# Patient Record
Sex: Male | Born: 1940 | Race: White | Hispanic: No | Marital: Married | Smoking: Former smoker
Health system: Southern US, Community
[De-identification: ages and names within clinical notes are randomized; demographics above are authoritative.]

## PROBLEM LIST (undated history)

## (undated) DIAGNOSIS — E785 Hyperlipidemia, unspecified: Secondary | ICD-10-CM

## (undated) DIAGNOSIS — C4492 Squamous cell carcinoma of skin, unspecified: Secondary | ICD-10-CM

## (undated) DIAGNOSIS — E119 Type 2 diabetes mellitus without complications: Secondary | ICD-10-CM

## (undated) DIAGNOSIS — G4730 Sleep apnea, unspecified: Secondary | ICD-10-CM

## (undated) DIAGNOSIS — C3490 Malignant neoplasm of unspecified part of unspecified bronchus or lung: Secondary | ICD-10-CM

## (undated) DIAGNOSIS — C4491 Basal cell carcinoma of skin, unspecified: Secondary | ICD-10-CM

## (undated) DIAGNOSIS — N200 Calculus of kidney: Secondary | ICD-10-CM

## (undated) DIAGNOSIS — I10 Essential (primary) hypertension: Secondary | ICD-10-CM

## (undated) DIAGNOSIS — M1990 Unspecified osteoarthritis, unspecified site: Secondary | ICD-10-CM

## (undated) DIAGNOSIS — R0600 Dyspnea, unspecified: Secondary | ICD-10-CM

## (undated) DIAGNOSIS — Z87442 Personal history of urinary calculi: Secondary | ICD-10-CM

## (undated) DIAGNOSIS — J449 Chronic obstructive pulmonary disease, unspecified: Secondary | ICD-10-CM

## (undated) DIAGNOSIS — J189 Pneumonia, unspecified organism: Secondary | ICD-10-CM

## (undated) DIAGNOSIS — C679 Malignant neoplasm of bladder, unspecified: Secondary | ICD-10-CM

## (undated) HISTORY — DX: Calculus of kidney: N20.0

## (undated) HISTORY — DX: Chronic obstructive pulmonary disease, unspecified: J44.9

## (undated) HISTORY — DX: Hyperlipidemia, unspecified: E78.5

## (undated) HISTORY — PX: HERNIA REPAIR: SHX51

## (undated) HISTORY — DX: Essential (primary) hypertension: I10

## (undated) HISTORY — PX: BLADDER FULGURATION: SHX1237

---

## 2004-09-08 ENCOUNTER — Ambulatory Visit: Payer: Self-pay | Admitting: Family Medicine

## 2006-04-30 ENCOUNTER — Ambulatory Visit: Payer: Self-pay | Admitting: Family Medicine

## 2006-05-22 ENCOUNTER — Ambulatory Visit: Payer: Self-pay | Admitting: Family Medicine

## 2006-08-12 ENCOUNTER — Other Ambulatory Visit: Payer: Self-pay

## 2006-08-13 ENCOUNTER — Inpatient Hospital Stay: Payer: Self-pay | Admitting: Internal Medicine

## 2007-12-12 ENCOUNTER — Ambulatory Visit: Payer: Self-pay | Admitting: Family Medicine

## 2013-03-22 ENCOUNTER — Ambulatory Visit: Payer: Self-pay | Admitting: Internal Medicine

## 2013-03-28 DIAGNOSIS — C679 Malignant neoplasm of bladder, unspecified: Secondary | ICD-10-CM | POA: Insufficient documentation

## 2013-04-05 ENCOUNTER — Ambulatory Visit: Payer: Self-pay | Admitting: Urology

## 2013-05-04 ENCOUNTER — Ambulatory Visit: Payer: Self-pay | Admitting: Urology

## 2013-05-10 ENCOUNTER — Ambulatory Visit: Payer: Self-pay | Admitting: Urology

## 2013-05-12 LAB — PATHOLOGY REPORT

## 2013-11-08 ENCOUNTER — Ambulatory Visit: Payer: Self-pay | Admitting: Urology

## 2013-11-08 LAB — BASIC METABOLIC PANEL
Anion Gap: 1 — ABNORMAL LOW (ref 7–16)
BUN: 23 mg/dL — AB (ref 7–18)
CALCIUM: 10 mg/dL (ref 8.5–10.1)
CHLORIDE: 103 mmol/L (ref 98–107)
Co2: 36 mmol/L — ABNORMAL HIGH (ref 21–32)
Creatinine: 1.13 mg/dL (ref 0.60–1.30)
EGFR (Non-African Amer.): >60
GLUCOSE: 112 mg/dL — AB (ref 65–99)
Osmolality: 284 (ref 275–301)
Potassium: 4.3 mmol/L (ref 3.5–5.1)
Sodium: 140 mmol/L (ref 136–145)

## 2013-11-22 ENCOUNTER — Ambulatory Visit: Payer: Self-pay | Admitting: Urology

## 2013-11-24 LAB — PATHOLOGY REPORT

## 2014-04-23 DIAGNOSIS — J449 Chronic obstructive pulmonary disease, unspecified: Secondary | ICD-10-CM | POA: Insufficient documentation

## 2014-05-17 ENCOUNTER — Ambulatory Visit: Payer: Self-pay | Admitting: Urology

## 2014-06-26 ENCOUNTER — Ambulatory Visit: Payer: Self-pay | Admitting: Urology

## 2014-06-26 LAB — BASIC METABOLIC PANEL
Anion Gap: 6 — ABNORMAL LOW (ref 7–16)
BUN: 27 mg/dL — ABNORMAL HIGH (ref 7–18)
CALCIUM: 9.6 mg/dL (ref 8.5–10.1)
CREATININE: 1.2 mg/dL (ref 0.60–1.30)
Chloride: 106 mmol/L (ref 98–107)
Co2: 31 mmol/L (ref 21–32)
GFR CALC NON AF AMER: 59 — AB
GLUCOSE: 88 mg/dL (ref 65–99)
Osmolality: 290 (ref 275–301)
POTASSIUM: 3.8 mmol/L (ref 3.5–5.1)
Sodium: 143 mmol/L (ref 136–145)

## 2014-06-26 LAB — CBC WITH DIFFERENTIAL/PLATELET
Basophil #: 0.1 10*3/uL (ref 0.0–0.1)
Basophil %: 0.5 %
EOS ABS: 0.6 10*3/uL (ref 0.0–0.7)
Eosinophil %: 4 %
HCT: 41.8 % (ref 40.0–52.0)
HGB: 12.9 g/dL — ABNORMAL LOW (ref 13.0–18.0)
Lymphocyte #: 5.7 10*3/uL — ABNORMAL HIGH (ref 1.0–3.6)
Lymphocyte %: 38.4 %
MCH: 28.5 pg (ref 26.0–34.0)
MCHC: 30.8 g/dL — AB (ref 32.0–36.0)
MCV: 93 fL (ref 80–100)
Monocyte #: 1 x10 3/mm (ref 0.2–1.0)
Monocyte %: 6.7 %
Neutrophil #: 7.5 10*3/uL — ABNORMAL HIGH (ref 1.4–6.5)
Neutrophil %: 50.4 %
PLATELETS: 309 10*3/uL (ref 150–440)
RBC: 4.52 10*6/uL (ref 4.40–5.90)
RDW: 15.7 % — AB (ref 11.5–14.5)
WBC: 14.8 10*3/uL — ABNORMAL HIGH (ref 3.8–10.6)

## 2014-07-25 ENCOUNTER — Ambulatory Visit: Payer: Self-pay | Admitting: Urology

## 2014-07-30 LAB — PATHOLOGY REPORT

## 2015-02-08 NOTE — Op Note (Signed)
PATIENT NAME:  [name] [name] MR#:  [id number] DATE OF BIRTH:  04-03-1941  DATE OF PROCEDURE:  05/10/2013  PREOPERATIVE DIAGNOSES:   1.  Bladder tumor.  2.  Gross hematuria.  POSTOPERATIVE DIAGNOSES:   1.  Bladder tumor.  2.  Gross hematuria.   PROCEDURES: 1.  Transurethral resection of bladder tumor.  2.  Placement of left ureteral stent.  3.  Bilateral retrograde pyelogram.   SURGEON:  John Giovanni, M.D.   ASSISTANT:  None.   ANESTHESIA:  General.   INDICATIONS:  A 74 year old male with a history of gross hematuria.  His primary care obtained a renal ultrasound which showed normal-appearing kidneys.  He was found to have a hyperechoic exophytic mass of the bladder wall.  He opted for cystoscopy under anesthesia with possible TURBT in lieu of office cystoscopy.  His upper tracts have only been evaluated by renal ultrasound and bilateral retrograde pyelograms will also be performed.   FINDINGS: 1.  Urethra normal in caliber without stricture.  2.  The prostate remarkable for moderate lateral lobe enlargement with mild bladder neck elevation.  3.  Papillary exophytic high-grade-appearing tumor of the posterior bladder wall measuring approximately 3 cm with papillary change extending inferolaterally to obscure the left ureteral orifice.  Right ureteral orifice was normal-appearing with clear efflux.  Clear efflux was noted from the left ureteral orifice.  Remainder of the bladder mucosa was normal in appearance.  4.  Retrograde pyelograms, right ureter normal in caliber without evidence of filling defects, dilation.  The right collecting system was normal in appearance without filling defects.  Left retrograde pyelogram, again normal-appearing ureter and collecting system/renal pelvis.   DESCRIPTION OF PROCEDURE:  The patient was taken to the Operating Room and placed supine on the cystoscopy table.  A general anesthetic was administered via an LMA.  He was then placed in the low lithotomy  position and his external genitalia were prepped and draped in the usual fashion.  A time out was performed per protocol with all in agreement.  The urethral meatus would not accept a 27 French continuous flow resectoscope sheath and the meatus was dilated from 22 to 30 [place] with [employer].  A 27 French continuous flow resectoscope sheath with visual obturator was then lubricated and passed without difficulty.  Panendoscopy was then performed with findings as described above.  Using an adapter in the resectoscope, an 8 French cone tip catheter was placed and right retrograde pyelogram was performed with findings as described above.  Left retrograde pyelogram was performed in a similar fashion.  Under realtime fluoroscopy, there was good efflux of contrast seen visually and radiographically.  Although the papillary tumor did not appear to involve the left ureteral orifice, it was surrounding the orifice and it was elected to place a ureteral catheter.  A 0.025 guidewire was placed into the left ureteral orifice and a 5 French open-ended ureteral catheter was placed over the wire.  The [facility] resectoscope with loop was then placed into the sheath.  The posterior wall tumor was resected down to superficial muscle.  All specimen was irrigated and removed.  The additional superficial papillary tumor was resected.  The papillary tumor in the vicinity of the ureteral orifice was fulgurated.  All specimen was removed.  A second specimen with sections involving the base of tumor was then performed and sent.  Hemostasis was obtained with a button electrode.  At the completion of the procedure, there was no other visible tumor.  Because  of the possibility of ureteral edema, it was elected to place a stent.  The ureteral catheter was removed and a 6 French/24 cm Contour ureteral stent was placed over the guidewire.  There was a good curl seen in the renal pelvis under fluoroscopy.  The distal end of the stent was  well-positioned in the bladder distally.  The stent was left attached to a string.  A 20 French Foley catheter was then placed with return of clear effluent upon irrigation.  The patient was taken to PACU in stable condition.  There were no complications.  EBL was minimal.     ____________________________ [name] [name]. Bernardo Heater, MD scs:ea D: 05/10/2013 16:54:40 ET T: 05/10/2013 19:41:51 ET JOB#: [id number]  cc: Nicki Reaper C. Bernardo Heater, MD, <Dictator> Abbie Sons MD ELECTRONICALLY SIGNED 05/22/2013 8:31

## 2015-02-09 NOTE — Op Note (Signed)
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   POSTOPERATIVE DIAGNOSIS: Transitional cell carcinoma of the bladder.   PROCEDURE: Transurethral resection of bladder tumor.   SURGEON: Danyeal Akens C. Bernardo Heater, M.D.   ASSISTANT: None.  ANESTHESIA: General.   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 He is status post prior TURBT approximately 4 months ago. Intravesical BCG was recommended; however, he refused. Recent surveillance cystoscopy remarkable for recurrent tumor of the left lateral wall.   DESCRIPTION OF PROCEDURE: The patient was taken to the cystoscopy suite and placed on the table in the supine position. A general anesthetic was administered via a LMA. He was then placed in the low lithotomy position and his external genitalia were prepped and draped in the usual fashion. Timeout was performed per protocol with all in agreement. The urethra meatus was sounded to 28-French. A 27-French continuous flow resectoscope sheath with visual obturator was then placed without difficulty. The urethra was normal in caliber without stricture, prostate with moderate lateral lobe enlargement. Bladder mucosa was closely inspected and on the left lateral wall was a broad-based patch of a papillary tumor measuring approximately 4 cm. There was a second 5 mm papillary lesion in the region of the dome. The left lateral wall tumor was resected down to superficial muscle. Hemostasis was obtained with cautery. The ureteral orifices were normal-appearing with clear efflux and the tumor was away from the ureteral orifice. All specimen was removed with irrigation and sent in a separate container. The dome tumor was then resected, however, based on its location, only a small amount of tissue was obtained as the majority of  it was cauterized. All resection sites were then cauterized, in addition to the surrounding mucosa. At the completion of procedure, hemostasis was adequate. All specimen had been removed. The resectoscope was removed. A 20-French Foley catheter was placed with return of clear effluent upon irrigation. A B and O suppository was placed per rectum. The patient was taken to the PAC-U in stable condition. There were no complications.   ESTIMATED BLOOD LOSS: Minimal.  ____________________________ Ronda Fairly. Bernardo Heater, MD scs:sb D: 11/28/2013 08:02:37 ET T: 11/28/2013 09:09:15 ET JOB#: [id number]  cc: Nicki Reaper C. Bernardo Heater, MD, <Dictator> Abbie Sons MD ELECTRONICALLY SIGNED 01/03/2014 9:58

## 2015-02-09 NOTE — Op Note (Signed)
PATIENT NAME:  [name] [name], [name] [name] MR#:  [id number] DATE OF BIRTH:  01/14/1941  DATE OF PROCEDURE:  07/25/2014  PREOPERATIVE DIAGNOSES: history of bladder cancer, left ureteral filling defect and CT urogram.   POSTOPERATIVE DIAGNOSES: No ureteral tumor identified, bladder tumor.   PROCEDURE PERFORMED: Transurethral urethral resection of bladder tumor less than 2 cm, bladder biopsy, left retrograde pyelogram, left ureteroscopy, left ureteral stent placement, instillation of chemotherapeutic agen to the bladder (mitomycin).   ANESTHESIA: General anesthesia.   ATTENDING SURGEON: Sherlynn Stalls, MD.   SPECIMEN: Random bladder biopsy, bladder tumor.  FINDINGS: Approximately 1 cm papillary sessile bladder tumor located on the left lateral wall of the bladder. Multiple suspicious erythematous patches on the posterior aspect of bladder wall. No evidence of left ureteral tumor.   DRAINS: A 6 x 26 French double-J ureteral stent on string (left side), 16 French Foley catheter.   COMPLICATIONS: None.   INDICATION: This is a 74 year old male with a history of bladder cancer found to have suspicious possible filling defect in the left ureter. He was also noted to have some bladder wall thickening suspicious for bladder cancer recurrence. He was counseled to undergo left retrograde pyelogram and left ureteroscopy for diagnostic purposes. Risks and benefits of the procedures were explained in detail. The patient agreed to proceed as planned.  PROCEDURE: The patient was correctly identified in the preoperative holding area and informed consent was obtained. He was brought to the operating suite and placed on the table in the supine position. At this time universal timeout protocol was performed. All team members were identified. Venodyne boots were placed and he was administered 2 grams of IV Ancef in the perioperative period. He was then placed under general anesthesia, positioned in the dorsal lithotomy position  on the bed and prepped and draped in standard surgical fashion.   A 22 French rigid cystoscope was then advanced per urethra into the bladder and the bladder was carefully inspected. This was performed both with a 30 degree and 70 degree lens. 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 There were no other tumors identified within the bladder. The right UO appeared orthotopic and normal; however, the left UO appeared as though it had been previously resected, somewhat patulous and more lateral and superior then would be anatomically expected. There was no evidence of tumors or lesions around this area with normal urothelium. At this point in time, cold cup biopsy forceps were used to biopsy the posterior and posterior lateral walls of the bladder and the areas of the suspicious erythema and these were sent as random bladder biopsies. The left UO was then cannulated using a 5 [place] open-ended ureteral catheter and a retrograde pyelogram was performed. This revealed an absolutely normal caliber ureter all the way up to the level of the renal pelvis. There was no evidence of hydronephrosis or filling defects within the renal pelvis or calyces. There was no concern for ureteral tumor at this time, however, given the findings on CT scan the decision was made to go ahead and proceed with left ureteroscopy in order to confirm this. A sensor was placed on the way up to the level of the kidney under fluoroscopic guidance which was snapped in place with a safety wire. A long rigid ureteroscope was then advanced through the patulous left UO, which revealed a normal left ureter without any evidence of mucosal defects or tumors. This  was able to be advanced all the way up to the level of near the UPJ well past the suspicious areas on CT urogram. As the ureter appeared completely normal the scope was removed and a 6 x 26 French  double-J ureteral stent was advanced over the wire to the level of the renal pelvis and then the wire was withdrawn until an adequate coil was noted both within the bladder and the renal pelvis. The string was left in place which was secured to the glans using Benzoin  and Steri-Strips. Stent placement was performed at the very end of the case and prior to doing this I did bring in a 26 French Olympus bipolar resectoscope to resect the left lateral bladder tumor. This specimen was sent off as bladder tumor.   Hemostasis was achieved using the loop. The bladder was then drained. A 16 French Foley catheter was then introduced alongside of the stent string to the bladder of which the balloon port was then filled with 10 mL of sterile water; 40 mg of mitomycin was then instilled into the bladder and clamped off for approximately an hour. The patient was then awaken from anesthesia and taken to the PACU in stable condition. His Foley was carefully removed in the PACU without complication leaving the stent in place.   There were no complications in this case. He will follow up in a week to review pathology and self remove the stent in 2 days.     ____________________________ Sherlynn Stalls, MD ajb:JT D: 07/25/2014 17:35:21 ET T: 07/25/2014 20:38:56 ET JOB#: [id number]  cc: Sherlynn Stalls, MD, <Dictator> Sherlynn Stalls MD ELECTRONICALLY SIGNED 07/29/2014 9:05

## 2015-05-03 ENCOUNTER — Other Ambulatory Visit: Payer: Self-pay | Admitting: Urology

## 2016-10-05 NOTE — Progress Notes (Signed)
10/06/2016 12:14 PM   Randall Hiss 06/24/1941 [id number]  Referring provider: Albina Billet, MD [address]   [place], [place] [address]  Chief Complaint  Patient presents with  . Hematuria    HPI: Patient is a 75 -year-old Caucasian male who presents today as a referral from their PCP, Dr. Hall Busing, for gross hematuria.    Patient is having intermittent gross hematuria with clots that started one month ago.   HCT was 35.3% in 2014, now it is 30% on 09/21/2016.    He does have a history of high grade urothelial carcinoma undergoing TURBT with left retrograde for diagnosis in 07/2014.  He had an induction course of BCG following this procedure.   He underwent surveillance cystoscopy in 01/2015.  He was to RTC in 3 months, but he cancelled that appointment.   He did not rescheduled that appointment due to feeling overwrought.  He had also seen Dr. Bernardo Heater in the past and BCG was recommended, but the patient refused.    Today, he is having symptoms of frequent urination, urgency, nocturia, incontinence, hesitancy, intermittency and a weak urinary stream. His UA today demonstrates > 30 RBC's and 11-30 WBC's.  He is not experiencing any suprapubic pain, abdominal pain or flank pain. He denies any recent fevers, chills, nausea or vomiting.   He did have a CT Urogram in 04/2014 with regard to potential residual or metachronous transitional cell carcinoma, there is some mild but smooth wall thickening along the left urinary bladder which probably merits cystoscopic investigation, and several subtle foci of potential posterior wall irregularity/thickening in the left ureter best appreciated on the delayed phase images as detailed above, which could represent ureteral tumor.  Numerous ancillary findings including bibasilar scarring, coronary atherosclerosis, low-density blood pool possibly suggesting anemia, chololithiasis, diffuse colonic diverticulosis, umbilical hernia containing adipose tissue,  lower lumbar spondylosis and degenerative disc disease causing bilateral foraminal impingement at L5-S1, mildly prominent prostate gland, the could and a 1.7 cm fluid density nonenhancing structure interposed between the urinary bladder and left inguinal ring which is probably a postoperative fluid collection.   He is a former smoker, with a 50 ppd history.  He is exposed to secondhand smoke.  He has not worked with [employer].    PMH: Past Medical History:  Diagnosis Date  . COPD (chronic obstructive pulmonary disease) ([place])   . Hyperlipidemia   . Hypertension   . Kidney stone     Surgical History: Past Surgical History:  Procedure Laterality Date  . BLADDER FULGURATION    . HERNIA REPAIR      Home Medications:  Allergies as of 10/06/2016   No Known Allergies     Medication List       Accurate as of 10/06/16 12:14 PM. Always use your most recent med list.          albuterol 108 (90 Base) MCG/ACT inhaler Commonly known as:  PROVENTIL HFA;VENTOLIN HFA Inhale into the lungs.   albuterol (2.5 MG/3ML) 0.083% nebulizer solution Commonly known as:  PROVENTIL   aspirin 81 MG chewable tablet once daily.   diphenhydramine-acetaminophen 25-500 MG Tabs tablet Commonly known as:  TYLENOL PM Take by mouth.   DOCOSAHEXAENOIC ACID PO Take by mouth.   glimepiride 2 MG tablet Commonly known as:  AMARYL   lisinopril 40 MG tablet Commonly known as:  PRINIVIL,ZESTRIL 20 mg.   metFORMIN 500 MG 24 hr tablet Commonly known as:  GLUCOPHAGE-XR   simvastatin 40 MG  tablet Commonly known as:  Lopeno 18 MCG inhalation capsule Generic drug:  tiotropium   SYMBICORT 80-4.5 MCG/ACT inhaler Generic drug:  budesonide-formoterol   tamsulosin 0.4 MG Caps capsule Commonly known as:  FLOMAX TAKE 1 TABLET BY MOUTH EVERY NIGHT   umeclidinium-vilanterol 62.5-25 MCG/INH Aepb Commonly known as:  ANORO ELLIPTA Inhale into the lungs.   Vitamin D3 2000  units capsule Take by mouth.       Allergies: No Known Allergies  Family History: Family History  Problem Relation Age of Onset  . Prostate cancer Neg Hx   . Bladder Cancer Neg Hx   . Kidney cancer Neg Hx     Social History:  reports that he has been smoking.  His smokeless tobacco use includes Snuff. He reports that he does not drink alcohol or use drugs.  ROS: UROLOGY Frequent Urination?: Yes Hard to postpone urination?: Yes Burning/pain with urination?: No Get up at night to urinate?: Yes Leakage of urine?: Yes Urine stream starts and stops?: Yes Trouble starting stream?: Yes Do you have to strain to urinate?: No Blood in urine?: Yes Urinary tract infection?: No Sexually transmitted disease?: No Injury to kidneys or bladder?: No Painful intercourse?: No Weak stream?: No Erection problems?: No Penile pain?: No  Gastrointestinal Nausea?: No Vomiting?: No Indigestion/heartburn?: No Diarrhea?: No Constipation?: No  Constitutional Fever: Yes Night sweats?: No Weight loss?: No Fatigue?: Yes  Skin Skin rash/lesions?: No Itching?: No  Eyes Blurred vision?: No Double vision?: No  Ears/Nose/Throat Sore throat?: No Sinus problems?: Yes  Hematologic/Lymphatic Swollen glands?: No Easy bruising?: Yes  Cardiovascular Leg swelling?: No Chest pain?: No  Respiratory Cough?: Yes Shortness of breath?: Yes  Endocrine Excessive thirst?: No  Musculoskeletal Back pain?: Yes Joint pain?: Yes  Neurological Headaches?: No Dizziness?: No  Psychologic Depression?: No Anxiety?: No  Physical Exam: BP 137/66 (BP Location: Left Arm, Patient Position: Sitting, Cuff Size: Normal)   Pulse 74   Ht '5\' 5"'$  (1.651 m)   Wt 180 lb 1.6 oz (81.7 kg)   BMI 29.97 kg/m   Constitutional: Well nourished. Alert and oriented, No acute distress. HEENT: [place] AT, moist mucus membranes. Trachea midline, no masses. Cardiovascular: No clubbing, cyanosis, or  edema. Respiratory: Normal respiratory effort, no increased work of breathing. GI: Abdomen is soft, non tender, non distended, no abdominal masses. Liver and spleen not palpable.  No hernias appreciated.  Stool sample for occult testing is not indicated.   GU: No CVA tenderness.  No bladder fullness or masses.  Patient with uncircumcised phallus. Urethral meatus is patent.  No penile discharge. No penile lesions or rashes. Scrotum without lesions, cysts, rashes and/or edema.  Testicles are located scrotally bilaterally. No masses are appreciated in the testicles. Left and right epididymis are normal. Rectal: Patient with  normal sphincter tone. Anus and perineum without scarring or rashes. No rectal masses are appreciated. Prostate is approximately 50 grams, no nodules are appreciated. Seminal vesicles are normal. Skin: No rashes, bruises or suspicious lesions. Lymph: No cervical or inguinal adenopathy. Neurologic: Grossly intact, no focal deficits, moving all 4 extremities. Psychiatric: Normal mood and affect.  Laboratory Data: Lab Results  Component Value Date   WBC 14.8 (H) 06/26/2014   HGB 12.9 (L) 06/26/2014   HCT 41.8 06/26/2014   MCV 93 06/26/2014   PLT 309 06/26/2014    Lab Results  Component Value Date   CREATININE 1.20 06/26/2014     Urinalysis 11-30 WBC's and > 30 RBC's.  See EPIC.  Pertinent Imaging: CLINICAL DATA:  Bladder cancer 1 year ago, status post prior bladder  surgeries and single course of chemotherapy. Hematuria.   EXAM:  CT ABDOMEN AND PELVIS WITHOUT AND WITH CONTRAST   TECHNIQUE:  Multidetector CT imaging of the abdomen and pelvis was performed  following the standard protocol before and following the bolus  administration of intravenous contrast.   CONTRAST:  125 cc Isovue 370   COMPARISON:  12/12/2007   FINDINGS:  Bandlike opacities compatible with atelectasis or scarring in the  lingula, right middle lobe, and anteriorly in both lower  lobes.  Small focus of bronchiectasis in the left lower lobe shown on image  7 of series 4. There is a below posteromedially in the posterior  basal segment left lower lobe.   Coronary artery atherosclerosis is present.  Low-density blood pool.   Layering gallstones in the gallbladder, largest gallstone 9 mm in  long axis.   No renal or ureteral calculi. 7 mm cyst, left kidney lower pole.  Several tiny hypodense lesions in the right kidney upper pole are  statistically likely to be cysts but technically nonspecific. 1.5 x  1.2 by 1.1 cm hypodense lesion posteriorly in the right mid kidney,  image 55 series 10, favoring a Bosniak category 2 cyst.   There is slightly asymmetric wall thickening of the left posterior  urinary bladder compared to the right, but without nodularity  observed. There is also an abnormal appearance of the left ureter,  with a suspicion for assess I will filling defect along the  posterior ureteral wall as shown on image 136 of series 15 in the  mid ureter, and possible posterior wall thickening in the distal  most left ureter as on image 127 of series 15. Neither of these are  absolutely certain as I do not show corresponding portal venous  phase enhancement in the ureteral wall in either location.   1.7 x 1.4 cm fluid density nonenhancing structure interposed between  the urinary bladder and left inguinal ring may be postoperative from  prior inguinal hernia a surgery or other surgery. This seems less  likely to represent a tumor deposit given the lack of enhancement.   The liver, spleen, and adrenal glands appear normal. Aortoiliac  atherosclerotic vascular disease. No pathologic upper abdominal  adenopathy is observed. Diffuse colonic diverticulosis. Umbilical  hernia contains 4.6 x 2.7 cm of adipose tissue.   Degenerative disc disease and spondylosis noted in the lower lumbar  spine, with foraminal stenosis observed at the L5-S1 level   bilaterally.   Mildly prominent prostate gland. No compelling findings of osseous  metastatic disease.   IMPRESSION:  1. With regard to potential residual or metachronous transitional  cell carcinoma, there is some mild but smooth wall thickening along  the left urinary bladder which probably merits cystoscopic  investigation, and several subtle foci of potential posterior wall  irregularity/thickening in the left ureter best appreciated on the  delayed phase images as detailed above, which could represent  ureteral tumor.  2. Numerous ancillary findings including bibasilar scarring,  coronary atherosclerosis, low-density blood pool possibly suggesting  anemia, chololithiasis, diffuse colonic diverticulosis, umbilical  hernia containing adipose tissue, lower lumbar spondylosis and  degenerative disc disease causing bilateral foraminal impingement at  L5-S1, mildly prominent prostate gland, the could and a 1.7 cm fluid  density nonenhancing structure interposed between the urinary  bladder and left inguinal ring which is probably a postoperative  fluid collection.  Electronically Signed    By: Sherryl Barters M.D.    On: 05/17/2014 11:05      Assessment & Plan:    1. Gross hematuria  - I explained to the patient that there are a number of causes that can be associated with blood in the urine, such as stones, BPH, UTI's, damage to the urinary tract and/or cancer.  - At this time, I felt that the patient warranted further urologic evaluation.   The AUA guidelines state that a CT urogram is the preferred imaging study to evaluate hematuria.  - I expressed my concern regarding his history of untreated bladder cancer causing these symptoms and would like to obtain the CT Urogram A.S.A.P.    - I explained to the patient that a contrast material will be injected into a vein and that in rare instances, an allergic reaction can result and may even life threatening   The patient  denies any allergies to contrast, iodine and/or seafood and is not taking metformin.  - Following the imaging study,  I've recommended a cystoscopy. I described how this is performed, typically in an office setting with a flexible cystoscope. We described the risks, benefits, and possible side effects, the most common of which is a minor amount of blood in the urine and/or burning which usually resolves in 24 to 48 hours.    - The patient had the opportunity to ask questions which were answered. Based upon this discussion, the patient is willing to proceed. Therefore, I've ordered: a CT Urogram and cystoscopy.  - The patient will return following all of the above for discussion of the results.     - BUN + creatinine  - urinalysis  - urine culture  - advised to seek treatment in the ED if he should develop chest pain, tachycardia, dizziness, nausea or if hematuria does not stop/frank bleeding  2. History of high grade bladder cancer  - see HPI and above    Return for CT Urogram report and cystoscopy.  These notes generated with voice recognition software. I apologize for typographical errors.  Zara Council, [facility] [address] [place], [place] [address] [phone]

## 2016-10-06 ENCOUNTER — Ambulatory Visit (INDEPENDENT_AMBULATORY_CARE_PROVIDER_SITE_OTHER): Payer: Medicare Other | Admitting: Urology

## 2016-10-06 ENCOUNTER — Encounter: Payer: Self-pay | Admitting: Urology

## 2016-10-06 VITALS — BP 137/66 | HR 74 | Ht 65.0 in | Wt 180.1 lb

## 2016-10-06 DIAGNOSIS — Z8551 Personal history of malignant neoplasm of bladder: Secondary | ICD-10-CM

## 2016-10-06 DIAGNOSIS — R310 Gross hematuria: Secondary | ICD-10-CM

## 2016-10-06 LAB — URINALYSIS, COMPLETE
BILIRUBIN UA: NEGATIVE
NITRITE UA: NEGATIVE
SPEC GRAV UA: 1.015 (ref 1.005–1.030)
UUROB: 0.2 mg/dL (ref 0.2–1.0)
pH, UA: 5.5 (ref 5.0–7.5)

## 2016-10-06 LAB — MICROSCOPIC EXAMINATION: RBC, UA: >30 /hpf — ABNORMAL HIGH (ref 0–?)

## 2016-10-07 ENCOUNTER — Ambulatory Visit
Admission: RE | Admit: 2016-10-07 | Discharge: 2016-10-07 | Disposition: A | Discharge status: Home / Self Care | Payer: Medicare Other | Source: Ambulatory Visit | Attending: Urology | Admitting: Urology

## 2016-10-07 DIAGNOSIS — J479 Bronchiectasis, uncomplicated: Secondary | ICD-10-CM | POA: Insufficient documentation

## 2016-10-07 DIAGNOSIS — K8020 Calculus of gallbladder without cholecystitis without obstruction: Secondary | ICD-10-CM | POA: Insufficient documentation

## 2016-10-07 DIAGNOSIS — I700 Atherosclerosis of aorta: Secondary | ICD-10-CM | POA: Insufficient documentation

## 2016-10-07 DIAGNOSIS — K5730 Diverticulosis of large intestine without perforation or abscess without bleeding: Secondary | ICD-10-CM | POA: Diagnosis not present

## 2016-10-07 DIAGNOSIS — M5137 Other intervertebral disc degeneration, lumbosacral region: Secondary | ICD-10-CM | POA: Diagnosis not present

## 2016-10-07 DIAGNOSIS — I2510 Atherosclerotic heart disease of native coronary artery without angina pectoris: Secondary | ICD-10-CM | POA: Diagnosis not present

## 2016-10-07 DIAGNOSIS — K429 Umbilical hernia without obstruction or gangrene: Secondary | ICD-10-CM | POA: Diagnosis not present

## 2016-10-07 DIAGNOSIS — R310 Gross hematuria: Secondary | ICD-10-CM | POA: Diagnosis present

## 2016-10-07 HISTORY — DX: Type 2 diabetes mellitus without complications: E11.9

## 2016-10-07 LAB — BUN+CREAT
BUN / CREAT RATIO: 26 — AB (ref 10–24)
BUN: 28 mg/dL — ABNORMAL HIGH (ref 8–27)
CREATININE: 1.08 mg/dL (ref 0.76–1.27)
GFR calc Af Amer: 77 mL/min/{1.73_m2} (ref >59)
GFR calc non Af Amer: 66 mL/min/{1.73_m2} (ref >59)

## 2016-10-07 MED ORDER — IOPAMIDOL (ISOVUE-300) INJECTION 61%
125.0000 mL | Freq: Once | INTRAVENOUS | Status: AC | PRN
  Administered 2016-10-07: 125 mL via INTRAVENOUS

## 2016-10-07 MED ORDER — IOPAMIDOL (ISOVUE-370) INJECTION 76%: 125.0000 mL | Freq: Once | INTRAVENOUS | Status: DC | PRN

## 2016-10-12 LAB — CULTURE, URINE COMPREHENSIVE

## 2016-10-13 ENCOUNTER — Telehealth: Payer: Self-pay

## 2016-10-13 DIAGNOSIS — N390 Urinary tract infection, site not specified: Secondary | ICD-10-CM

## 2016-10-13 MED ORDER — SULFAMETHOXAZOLE-TRIMETHOPRIM 800-160 MG PO TABS: 1.0000 | ORAL_TABLET | Freq: Two times a day (BID) | ORAL | 0 refills | Status: DC

## 2016-10-13 NOTE — Telephone Encounter (Signed)
-----   Message from Nori Riis, PA-C sent at 10/12/2016  5:59 PM EST ----- Patient has a +UCx.  They need to start Septra DS,  one tablet twice daily for seven days.  They also need to take a probiotic with the antibiotic course.  The dosage is listed below:  L. acidophilus and L. casei (25 x 109 CFU/day for 2 days, then 50 x 109 CFU/day for duration of the antibiotic.

## 2016-10-13 NOTE — Telephone Encounter (Signed)
Patient was notified of results and script was sent to local pharm

## 2016-11-09 ENCOUNTER — Other Ambulatory Visit: Payer: Self-pay | Admitting: Radiology

## 2016-11-09 ENCOUNTER — Telehealth: Payer: Self-pay | Admitting: Radiology

## 2016-11-09 DIAGNOSIS — D494 Neoplasm of unspecified behavior of bladder: Secondary | ICD-10-CM

## 2016-11-09 NOTE — Telephone Encounter (Signed)
Per Dr Raul Del, may proceed with surgery on 12/02/16 provided pt does not have another COPD exacerbation. Notified pt of surgery scheduled with Dr Erlene Quan on 12/02/16, pre-admit testing appt on 11/20/16 '@10'$ :00 & to call day prior to surgery for arrival time to [facility]. Pt states he completed abx for ucx but feels he still has an infection. Per Dr Louis Meckel pt was advised to give urine sample at pre-admit testing appt prior to surgery. Pt voices understanding.

## 2016-11-16 ENCOUNTER — Ambulatory Visit
Admission: RE | Admit: 2016-11-16 | Discharge: 2016-11-16 | Disposition: A | Discharge status: Home / Self Care | Payer: Medicare Other | Source: Ambulatory Visit | Attending: Internal Medicine | Admitting: Internal Medicine

## 2016-11-16 ENCOUNTER — Other Ambulatory Visit: Payer: Self-pay | Admitting: Internal Medicine

## 2016-11-16 DIAGNOSIS — R059 Cough, unspecified: Secondary | ICD-10-CM

## 2016-11-16 DIAGNOSIS — R05 Cough: Secondary | ICD-10-CM

## 2016-11-16 DIAGNOSIS — R918 Other nonspecific abnormal finding of lung field: Secondary | ICD-10-CM | POA: Diagnosis not present

## 2016-11-20 ENCOUNTER — Encounter
Admission: RE | Admit: 2016-11-20 | Discharge: 2016-11-20 | Disposition: A | Discharge status: Home / Self Care | Payer: Medicare Other | Source: Ambulatory Visit | Attending: Urology | Admitting: Urology

## 2016-11-20 ENCOUNTER — Telehealth: Payer: Self-pay | Admitting: Radiology

## 2016-11-20 DIAGNOSIS — I10 Essential (primary) hypertension: Secondary | ICD-10-CM | POA: Diagnosis not present

## 2016-11-20 DIAGNOSIS — R918 Other nonspecific abnormal finding of lung field: Secondary | ICD-10-CM | POA: Diagnosis not present

## 2016-11-20 DIAGNOSIS — Z01818 Encounter for other preprocedural examination: Secondary | ICD-10-CM | POA: Diagnosis present

## 2016-11-20 DIAGNOSIS — Z01812 Encounter for preprocedural laboratory examination: Secondary | ICD-10-CM | POA: Diagnosis present

## 2016-11-20 DIAGNOSIS — E119 Type 2 diabetes mellitus without complications: Secondary | ICD-10-CM | POA: Diagnosis not present

## 2016-11-20 DIAGNOSIS — E785 Hyperlipidemia, unspecified: Secondary | ICD-10-CM | POA: Diagnosis not present

## 2016-11-20 DIAGNOSIS — I700 Atherosclerosis of aorta: Secondary | ICD-10-CM | POA: Diagnosis not present

## 2016-11-20 DIAGNOSIS — I2510 Atherosclerotic heart disease of native coronary artery without angina pectoris: Secondary | ICD-10-CM | POA: Diagnosis not present

## 2016-11-20 DIAGNOSIS — Z01810 Encounter for preprocedural cardiovascular examination: Secondary | ICD-10-CM | POA: Diagnosis present

## 2016-11-20 DIAGNOSIS — K8020 Calculus of gallbladder without cholecystitis without obstruction: Secondary | ICD-10-CM | POA: Diagnosis not present

## 2016-11-20 HISTORY — DX: Sleep apnea, unspecified: G47.30

## 2016-11-20 HISTORY — DX: Dyspnea, unspecified: R06.00

## 2016-11-20 LAB — BASIC METABOLIC PANEL
ANION GAP: 10 (ref 5–15)
BUN: 28 mg/dL — ABNORMAL HIGH (ref 6–20)
CALCIUM: 9.7 mg/dL (ref 8.9–10.3)
CO2: 34 mmol/L — AB (ref 22–32)
Chloride: 96 mmol/L — ABNORMAL LOW (ref 101–111)
Creatinine, Ser: 0.99 mg/dL (ref 0.61–1.24)
GFR calc non Af Amer: >60 mL/min (ref >60)
GLUCOSE: 90 mg/dL (ref 65–99)
POTASSIUM: 3.9 mmol/L (ref 3.5–5.1)
Sodium: 140 mmol/L (ref 135–145)

## 2016-11-20 LAB — CBC
HEMATOCRIT: 29.6 % — AB (ref 40.0–52.0)
HEMOGLOBIN: 9.5 g/dL — AB (ref 13.0–18.0)
MCH: 26.6 pg (ref 26.0–34.0)
MCHC: 32.1 g/dL (ref 32.0–36.0)
MCV: 82.8 fL (ref 80.0–100.0)
Platelets: 403 10*3/uL (ref 150–440)
RBC: 3.58 MIL/uL — AB (ref 4.40–5.90)
RDW: 16.9 % — ABNORMAL HIGH (ref 11.5–14.5)
WBC: 11.7 10*3/uL — ABNORMAL HIGH (ref 3.8–10.6)

## 2016-11-20 NOTE — Telephone Encounter (Signed)
LMOM. Need to discuss cardiac clearance per anesthesiology.

## 2016-11-20 NOTE — Patient Instructions (Addendum)
Your procedure is scheduled on: Wednesday 12/02/16 Report to [place]. 2ND FLOOR MEDICAL MALL ENTRANCE. To find out your arrival time please call [phone] between 1PM - 3PM on Tuesday 12/01/16.  Remember: Instructions that are not followed completely may result in serious medical risk, up to and including death, or upon the discretion of your surgeon and anesthesiologist your surgery may need to be rescheduled.    __X__ 1. Do not eat food or drink liquids after midnight. No gum chewing or hard candies.     __X__ 2. No Alcohol for 24 hours before or after surgery.   ____ 3. Bring all medications with you on the day of surgery if instructed.    __X__ 4. Notify your doctor if there is any change in your medical condition     (cold, fever, infections).             ___X__5. No smoking within 24 hours of your surgery.     Do not wear jewelry, make-up, hairpins, clips or nail polish.  Do not wear lotions, powders, or perfumes.   Do not shave 48 hours prior to surgery. Men may shave face and neck.  Do not bring valuables to the hospital.    [facility] is not responsible for any belongings or valuables.               Contacts, dentures or bridgework may not be worn into surgery.  Leave your suitcase in the car. After surgery it may be brought to your room.  For patients admitted to the hospital, discharge time is determined by your                treatment team.   Patients discharged the day of surgery will not be allowed to drive home.   Please read over the following fact sheets that you were given:   Pain Booklet and MRSA Information   __X__ Take these medicines the morning of surgery with A SIP OF WATER:    1. PREDNISONE  2. TRAMADOL IF NEEDED  3. USE ALL INHALERS AND NEBULIZER  4.  5.  6.  ____ Fleet Enema (as directed)   ____ Use CHG Soap as directed  __X__ Use inhalers on the day of surgery  ____ Stop metformin 2 days prior to surgery    ____ Take 1/2 of usual  insulin dose the night before surgery and none on the morning of surgery.   __X__ Stop Coumadin/Plavix/aspirin on 11/23/16  __X__ Stop Anti-inflammatories such as Advil, Aleve, Ibuprofen, Motrin, Naproxen, Naprosyn, Goodies,powder, or aspirin products.  OK to take Tylenol.   __X__ Stop supplements until after surgery.    ____ Bring C-Pap to the hospital.

## 2016-11-21 LAB — URINE CULTURE: Culture: NO GROWTH

## 2016-11-23 ENCOUNTER — Ambulatory Visit
Admission: RE | Admit: 2016-11-23 | Discharge: 2016-11-23 | Disposition: A | Discharge status: Home / Self Care | Payer: Medicare Other | Source: Ambulatory Visit | Attending: Internal Medicine | Admitting: Internal Medicine

## 2016-11-23 ENCOUNTER — Other Ambulatory Visit: Payer: Self-pay | Admitting: Radiology

## 2016-11-23 ENCOUNTER — Telehealth: Payer: Self-pay | Admitting: Urology

## 2016-11-23 DIAGNOSIS — Z01810 Encounter for preprocedural cardiovascular examination: Secondary | ICD-10-CM | POA: Insufficient documentation

## 2016-11-23 DIAGNOSIS — Z01812 Encounter for preprocedural laboratory examination: Secondary | ICD-10-CM | POA: Insufficient documentation

## 2016-11-23 DIAGNOSIS — I700 Atherosclerosis of aorta: Secondary | ICD-10-CM | POA: Insufficient documentation

## 2016-11-23 DIAGNOSIS — I10 Essential (primary) hypertension: Secondary | ICD-10-CM | POA: Insufficient documentation

## 2016-11-23 DIAGNOSIS — R918 Other nonspecific abnormal finding of lung field: Secondary | ICD-10-CM

## 2016-11-23 DIAGNOSIS — I2510 Atherosclerotic heart disease of native coronary artery without angina pectoris: Secondary | ICD-10-CM | POA: Insufficient documentation

## 2016-11-23 DIAGNOSIS — K8020 Calculus of gallbladder without cholecystitis without obstruction: Secondary | ICD-10-CM | POA: Insufficient documentation

## 2016-11-23 DIAGNOSIS — Z01818 Encounter for other preprocedural examination: Secondary | ICD-10-CM | POA: Insufficient documentation

## 2016-11-23 DIAGNOSIS — E119 Type 2 diabetes mellitus without complications: Secondary | ICD-10-CM | POA: Insufficient documentation

## 2016-11-23 DIAGNOSIS — E785 Hyperlipidemia, unspecified: Secondary | ICD-10-CM | POA: Insufficient documentation

## 2016-11-23 HISTORY — DX: Basal cell carcinoma of skin, unspecified: C44.91

## 2016-11-23 HISTORY — DX: Squamous cell carcinoma of skin, unspecified: C44.92

## 2016-11-23 HISTORY — DX: Malignant neoplasm of bladder, unspecified: C67.9

## 2016-11-23 LAB — POCT I-STAT CREATININE: CREATININE: 1.2 mg/dL (ref 0.61–1.24)

## 2016-11-23 MED ORDER — IOPAMIDOL (ISOVUE-300) INJECTION 61%
75.0000 mL | Freq: Once | INTRAVENOUS | Status: AC | PRN
  Administered 2016-11-23: 75 mL via INTRAVENOUS

## 2016-11-23 NOTE — Telephone Encounter (Signed)
Notified pt of cardiac clearance appt with Dr Yvone Neu on 11/26/16 '@11'$ :00 due to abnormal EKG. Pt voices understanding.

## 2016-11-23 NOTE — Telephone Encounter (Signed)
I reached out to the patient today to discuss his upcoming TURBT.  I explained to the patient how the procedure is performed and the risks involved; bleeding, pain, infection, trauma to the bladder and I also explained the risks of general anesthesia, such as: MI, CVA, paralysis, coma and/or death.  I also informed him that he would have a catheter in place after the procedure for a short time.  His questions were answered and he is comfortable with proceeding as he has had this procedure before.    He has had a CT of his chest recently and is expecting to hear the results sometime today.  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    He did mention to my that his BP was lower at 120/ 60 and he felt weak.  I instructed him to contact his PCP office or seek treatment in the ED.

## 2016-11-24 ENCOUNTER — Telehealth: Payer: Self-pay | Admitting: Cardiology

## 2016-11-24 NOTE — Telephone Encounter (Signed)
Cardiac clearance received for patient from [facility]. Clearance needs to be sent to Fax # [phone]

## 2016-11-25 ENCOUNTER — Other Ambulatory Visit: Payer: Self-pay | Admitting: *Deleted

## 2016-11-26 ENCOUNTER — Encounter: Payer: Self-pay | Admitting: Cardiology

## 2016-11-26 ENCOUNTER — Telehealth: Payer: Self-pay

## 2016-11-26 ENCOUNTER — Ambulatory Visit (INDEPENDENT_AMBULATORY_CARE_PROVIDER_SITE_OTHER): Payer: Medicare Other | Admitting: Cardiology

## 2016-11-26 ENCOUNTER — Other Ambulatory Visit: Payer: Medicare Other

## 2016-11-26 VITALS — BP 170/70 | HR 79 | Ht 65.0 in | Wt 174.5 lb

## 2016-11-26 DIAGNOSIS — E784 Other hyperlipidemia: Secondary | ICD-10-CM

## 2016-11-26 DIAGNOSIS — R0602 Shortness of breath: Secondary | ICD-10-CM

## 2016-11-26 DIAGNOSIS — Z01818 Encounter for other preprocedural examination: Secondary | ICD-10-CM

## 2016-11-26 DIAGNOSIS — R9431 Abnormal electrocardiogram [ECG] [EKG]: Secondary | ICD-10-CM

## 2016-11-26 DIAGNOSIS — E7849 Other hyperlipidemia: Secondary | ICD-10-CM

## 2016-11-26 DIAGNOSIS — I10 Essential (primary) hypertension: Secondary | ICD-10-CM

## 2016-11-26 NOTE — Telephone Encounter (Signed)
Patient's case was discussed in tumor board and imaging was reviewed. He has a large invasive mass in his chest with bilateral lung nodules and lymphadenopathy. At this point, unclear if this is primary lung versus metastatic bladder cancer.  Plans for a PET scan and biopsy were arranged today with the cancer center.  In light of these findings and possible stage IV lung cancer versus metastatic bladder cancer, role of TURBT right now is questionable. As such, we'll defer surgery next week until further diagnostic workup.  May consider cystoscopy in the office to confirm presence of bladder mass.  Left message on the patient's machine to call me back to discuss this further.  Hollice Espy, MD

## 2016-11-26 NOTE — Telephone Encounter (Signed)
Dr. Farrel Conners called in reference to CTscan results and wanted to know if this will change patient's surgery. Evaluation by oncology needed?

## 2016-11-26 NOTE — Progress Notes (Signed)
Cardiology Office Note   Date:  11/26/2016   ID:  [name] [name], DOB 05-Jun-1941, MRN [id number]  Referring Doctor:  Albina Billet, MD   Cardiologist:   Wende Bushy, MD   Reason for consultation:  Chief Complaint  Patient presents with  . OTHER    Cardiac clearance Bladder Tumor c/o right side chest pain. Meds reviewed verbally with pt.      History of Present Illness: [name] [name] is a 76 y.o. male who presents for Preoperative cardiac evaluation prior to bladder surgery.  Patient was noted to have an abnormal EKG.  Patient denies exertional chest pain. He has baseline shortness breath because of COPD. He does complain of worsening shortness of breath over the last several months. The severe intensity, further limits his physical capacity, occurs with exertion, improves with rest.  Again as mentioned, no discrete exertional chest pain. He has however constant right-sided chest pain. He believes this is related to the lung mass noted on that side. He is yet to talk to his PCP and pulmonologist regarding the findings of the CT scan.  Patient denies PND, orthopnea, edema.   ROS:  Please see the history of present illness. Aside from mentioned under HPI, all other systems are reviewed and negative.     Past Medical History:  Diagnosis Date  . Basal cell carcinoma of skin   . Bladder cancer ([place])   . COPD (chronic obstructive pulmonary disease) ([place])   . Diabetes mellitus without complication ([place])   . Dyspnea    with exertion  . Hyperlipidemia   . Hypertension   . Kidney stone   . Sleep apnea   . Squamous cell skin cancer     Past Surgical History:  Procedure Laterality Date  . BLADDER FULGURATION    . HERNIA REPAIR       reports that he has quit smoking. His smokeless tobacco use includes Snuff. He reports that he does not drink alcohol or use drugs.   family history includes Stroke in his father.   Outpatient Medications Prior to Visit  Medication Sig  Dispense Refill  . albuterol (PROVENTIL HFA;VENTOLIN HFA) 108 (90 Base) MCG/ACT inhaler Inhale 1-2 puffs into the lungs every 6 (six) hours as needed for wheezing or shortness of breath.     Marland Kitchen albuterol (PROVENTIL) (2.5 MG/3ML) 0.083% nebulizer solution Take 2.5 mg by nebulization every 4 (four) hours as needed for wheezing or shortness of breath.     Marland Kitchen aspirin 81 MG chewable tablet TAKE 1 TABLET (81 MG) BY MOUTH AT NIGHT    . budesonide-formoterol (SYMBICORT) 80-4.5 MCG/ACT inhaler Inhale 2 puffs into the lungs 2 (two) times daily.     . Cholecalciferol (VITAMIN D) 2000 units CAPS Take 2,000 Units by mouth daily.    . colchicine 0.6 MG tablet Take 0.6 mg by mouth daily.    Marland Kitchen glimepiride (AMARYL) 2 MG tablet Take 2 mg by mouth daily.     . Ibuprofen-Diphenhydramine Cit (ADVIL PM PO) Take 2 tablets by mouth at bedtime.    Marland Kitchen lisinopril-hydrochlorothiazide (PRINZIDE,ZESTORETIC) 20-12.5 MG tablet Take 1 tablet by mouth every evening.    . metFORMIN (GLUCOPHAGE-XR) 500 MG 24 hr tablet Take 1,000 mg by mouth 2 (two) times daily.     . Omega-3 Fatty Acids (FISH OIL) 1200 MG CAPS Take 1,200 mg by mouth daily.    . OXYGEN Inhale 2 L into the lungs continuous.    . simvastatin (ZOCOR) 40 MG tablet Take 40  mg by mouth daily.     . tamsulosin (FLOMAX) 0.4 MG CAPS capsule TAKE 1 TABLET BY MOUTH EVERY NIGHT BEFORE BEDTIME    . tiotropium (SPIRIVA HANDIHALER) 18 MCG inhalation capsule Place 18 mcg into inhaler and inhale at bedtime.     . traMADol (ULTRAM) 50 MG tablet Take 50-100 mg by mouth 2 (two) times daily as needed (for pain.).    Marland Kitchen predniSONE (DELTASONE) 10 MG tablet Take 10 mg by mouth 2 (two) times daily.    Marland Kitchen sulfamethoxazole-trimethoprim (BACTRIM DS,SEPTRA DS) 800-160 MG tablet Take 1 tablet by mouth every 12 (twelve) hours. (Patient not taking: Reported on 11/17/2016) 14 tablet 0   No facility-administered medications prior to visit.      Allergies: Patient has no known allergies.    PHYSICAL  EXAM: VS:  BP (!) 170/70 (BP Location: Left Arm, Patient Position: Sitting, Cuff Size: Normal)   Pulse 79   Ht '5\' 5"'$  (1.651 m)   Wt 174 lb 8 oz (79.2 kg)   BMI 29.04 kg/m  , Body mass index is 29.04 kg/m. Wt Readings from Last 3 Encounters:  11/26/16 174 lb 8 oz (79.2 kg)  11/20/16 174 lb (78.9 kg)  10/06/16 180 lb 1.6 oz (81.7 kg)    GENERAL:  well developed, well nourished, not in acute distress HEENT: normocephalic, pink conjunctivae, anicteric sclerae, no xanthelasma, normal dentition, oropharynx clear NECK:  no neck vein engorgement, JVP normal, no hepatojugular reflux, carotid upstroke brisk and symmetric, no bruit, no thyromegaly, no lymphadenopathy LUNGS:  good respiratory effort, clear to auscultation bilaterally CV:  PMI not displaced, no thrills, no lifts, S1 and S2 within normal limits, no palpable S3 or S4, no murmurs, no rubs, no gallops ABD:  Soft, nontender, nondistended, normoactive bowel sounds, no abdominal aortic bruit, no hepatomegaly, no splenomegaly MS: nontender back, no kyphosis, no scoliosis, no joint deformities EXT:  2+ DP/PT pulses, no edema, no varicosities, no cyanosis, no clubbing SKIN: warm, nondiaphoretic, normal turgor, no ulcers NEUROPSYCH: alert, oriented to person, place, and time, sensory/motor grossly intact, normal mood, appropriate affect  Recent Labs: 11/20/2016: BUN 28; Hemoglobin 9.5; Platelets 403; Potassium 3.9; Sodium 140 11/23/2016: Creatinine, Ser 1.20   Lipid Panel No results found for: CHOL, TRIG, HDL, CHOLHDL, VLDL, LDLCALC, LDLDIRECT   Other studies Reviewed:  EKG:  The ekg from 11/20/2016 was personally reviewed by me and it revealed sinus rhythm, 85 BPM. Occasional PVCs. 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 Significant changes occurred since 06/26/2014 EKG. That was personally reviewed and that showed sinus rhythm, poor R-wave progression.  Additional studies/ records that were reviewed personally reviewed  by me today include: None available   ASSESSMENT AND PLAN: Preoperative evaluation Abnormal EKG with bifascicular block Hypertension Hyperlipidemia Shortness of breath, worse from baseline  I spoke on the phone with Dr. Erlene Quan of Urology, and she said that she will likely proceed with  bladder surgery despite the new findings of lung mass. Because of patient's symptoms and abnormal EKG, we will need to further risk stratify him with pharmacologic nuclear stress test and echocardiogram. He has significant risks for CAD including age, gender, hypertension, hyperlipidemia, diabetes.  Hypertension Blood pressure is elevated today, quite unusual for him. As patient and the wife to continue to monitor blood pressure, and call PCP our office if blood pressures greater than 140/80 persistently.  Hyperlipidemia Being followed by PCP ideal LDL goal is less than 70 due to diabetes.   Current medicines are reviewed at  length with the patient today.  The patient does not have concerns regarding medicines.  Labs/ tests ordered today include:  Orders Placed This Encounter  Procedures  . NM Myocar Multi W/Spect W/Wall Motion / EF  . EKG 12-Lead  . ECHOCARDIOGRAM COMPLETE    I had a lengthy and detailed discussion with the patient regarding diagnoses, prognosis, diagnostic options.  Disposition:   FU with undersigned after tests  Thank you for this consultation. We will forwarding this consultation to referring physician.   Signed, Wende Bushy, MD  11/26/2016 12:44 PM    [place]  This note was generated in part with voice recognition software and I apologize for any typographical errors that were not detected and corrected.

## 2016-11-26 NOTE — Patient Instructions (Addendum)
Testing/Procedures: Your physician has requested that you have an echocardiogram. Echocardiography is a painless test that uses sound waves to create images of your heart. It provides your doctor with information about the size and shape of your heart and how well your heart's chambers and valves are working. This procedure takes approximately one hour. There are no restrictions for this procedure.  [place]  Your caregiver has ordered a Stress Test with nuclear imaging. The purpose of this test is to evaluate the blood supply to your heart muscle. This procedure is referred to as a "Non-Invasive Stress Test." This is because other than having an IV started in your vein, nothing is inserted or "invades" your body. Cardiac stress tests are done to find areas of poor blood flow to the heart by determining the extent of coronary artery disease (CAD). Some patients exercise on a treadmill, which naturally increases the blood flow to your heart, while others who are  unable to walk on a treadmill due to physical limitations have a pharmacologic/chemical stress agent called Lexiscan . This medicine will mimic walking on a treadmill by temporarily increasing your coronary blood flow.   Please note: these test may take anywhere between 2-4 hours to complete  PLEASE REPORT TO [facility] AT THE FIRST DESK WILL DIRECT YOU WHERE TO GO  Date of Procedure:_Tuesday December 01, 2016 at 09:30AM__  Arrival Time for Procedure:__Arrive at 09:15AM to register__  Instructions regarding medication:   __X__ : Hold diabetes medication Glimepiride and Metformin the morning of procedure   PLEASE NOTIFY THE OFFICE AT LEAST 24 HOURS IN ADVANCE IF YOU ARE UNABLE TO KEEP YOUR APPOINTMENT.  [phone] AND  PLEASE NOTIFY NUCLEAR MEDICINE AT [facility] AT LEAST 24 HOURS IN ADVANCE IF YOU ARE UNABLE TO KEEP YOUR APPOINTMENT. [phone]  How to prepare for your Myoview test:  1. Do not eat  or drink after midnight 2. No caffeine for 24 hours prior to test 3. No smoking 24 hours prior to test. 4. Your medication may be taken with water.  If your doctor stopped a medication because of this test, do not take that medication. 5. Ladies, please do not wear dresses.  Skirts or pants are appropriate. Please wear a short sleeve shirt. 6. No perfume, cologne or lotion. 7. Wear comfortable walking shoes. No heels!   Follow-Up: Your physician recommends that you schedule a follow-up appointment after testing with Dr. Yvone Neu.   It was a pleasure seeing you today here in the office. Please do not hesitate to give [place] a call back if you have any further questions. [place], BSN    Echocardiogram An echocardiogram, or echocardiography, uses sound waves (ultrasound) to produce an image of your heart. The echocardiogram is simple, painless, obtained within a short period of time, and offers valuable information to your health care provider. The images from an echocardiogram can provide information such as:  Evidence of coronary artery disease (CAD).  Heart size.  Heart muscle function.  Heart valve function.  Aneurysm detection.  Evidence of a past heart attack.  Fluid buildup around the heart.  Heart muscle thickening.  Assess heart valve function. Tell a health care provider about:  Any allergies you have.  All medicines you are taking, including vitamins, herbs, eye drops, creams, and over-the-counter medicines.  Any problems you or family members have had with anesthetic medicines.  Any blood disorders you have.  Any surgeries you have had.  Any medical conditions you have.  Whether you are pregnant or may be pregnant. What happens before the procedure? No special preparation is needed. Eat and drink normally. What happens during the procedure?  In order to produce an image of your heart, gel will be applied to your chest and a wand-like tool  (transducer) will be moved over your chest. The gel will help transmit the sound waves from the transducer. The sound waves will harmlessly bounce off your heart to allow the heart images to be captured in real-time motion. These images will then be recorded.  You may need an IV to receive a medicine that improves the quality of the pictures. What happens after the procedure? You may return to your normal schedule including diet, activities, and medicines, unless your health care provider tells you otherwise. This information is not intended to replace advice given to you by your health care provider. Make sure you discuss any questions you have with your health care provider. Document Released: 10/02/2000 Document Revised: 05/23/2016 Document Reviewed: 06/12/2013 Elsevier Interactive Patient Education  2017 [place]. Pharmacologic Stress Electrocardiogram A pharmacologic stress electrocardiogram is a heart (cardiac) test that uses nuclear imaging to evaluate the blood supply to your heart. This test may also be called a pharmacologic stress electrocardiography. Pharmacologic means that a medicine is used to increase your heart rate and blood pressure.  This stress test is done to find areas of poor blood flow to the heart by determining the extent of coronary artery disease (CAD). Some people exercise on a treadmill, which naturally increases the blood flow to the heart. For those people unable to exercise on a treadmill, a medicine is used. This medicine stimulates your heart and will cause your heart to beat harder and more quickly, as if you were exercising.  Pharmacologic stress tests can help determine:  The adequacy of blood flow to your heart during increased levels of activity in order to clear you for discharge home.  The extent of coronary artery blockage caused by CAD.  Your prognosis if you have suffered a heart attack.  The effectiveness of cardiac procedures done, such as an  angioplasty, which can increase the circulation in your coronary arteries.  Causes of chest pain or pressure. LET [facility] CARE PROVIDER KNOW ABOUT:  Any allergies you have.  All medicines you are taking, including vitamins, herbs, eye drops, creams, and over-the-counter medicines.  Previous problems you or members of your family have had with the use of anesthetics.  Any blood disorders you have.  Previous surgeries you have had.  Medical conditions you have.  Possibility of pregnancy, if this applies.  If you are currently breastfeeding. RISKS AND COMPLICATIONS Generally, this is a safe procedure. However, as with any procedure, complications can occur. Possible complications include:  You develop pain or pressure in the following areas:  Chest.  Jaw or neck.  Between your shoulder blades.  Radiating down your left arm.  Headache.  Dizziness or light-headedness.  Shortness of breath.  Increased or irregular heartbeat.  Low blood pressure.  Nausea or vomiting.  Flushing.  Redness going up the arm and slight pain during injection of medicine.  Heart attack (rare). BEFORE THE PROCEDURE   Avoid all forms of caffeine for 24 hours before your test or as directed by your health care provider. This includes coffee, tea (even decaffeinated tea), caffeinated sodas, chocolate, cocoa, and certain pain medicines.  Follow your health care provider's instructions regarding eating and drinking before  the test.  Take your medicines as directed at regular times with water unless instructed otherwise. Exceptions may include:  If you have diabetes, ask how you are to take your insulin or pills. It is common to adjust insulin dosing the morning of the test.  If you are taking beta-blocker medicines, it is important to talk to your health care provider about these medicines well before the date of your test. Taking beta-blocker medicines may interfere with the test. In some  cases, these medicines need to be changed or stopped 24 hours or more before the test.  If you wear a nitroglycerin patch, it may need to be removed prior to the test. Ask your health care provider if the patch should be removed before the test.  If you use an inhaler for any breathing condition, bring it with you to the test.  If you are an outpatient, bring a snack so you can eat right after the stress phase of the test.  Do not smoke for 4 hours prior to the test or as directed by your health care provider.  Do not apply lotions, powders, creams, or oils on your chest prior to the test.  Wear comfortable shoes and clothing. Let your health care provider know if you were unable to complete or follow the preparations for your test. PROCEDURE   Multiple patches (electrodes) will be put on your chest. If needed, small areas of your chest may be shaved to get better contact with the electrodes. Once the electrodes are attached to your body, multiple wires will be attached to the electrodes, and your heart rate will be monitored.  An IV access will be started. A nuclear trace (isotope) is given. The isotope may be given intravenously, or it may be swallowed. Nuclear refers to several types of radioactive isotopes, and the nuclear isotope lights up the arteries so that the nuclear images are clear. The isotope is absorbed by your body. This results in low radiation exposure.  A resting nuclear image is taken to show how your heart functions at rest.  A medicine is given through the IV access.  A second scan is done about 1 hour after the medicine injection and determines how your heart functions under stress.  During this stress phase, you will be connected to an electrocardiogram machine. Your blood pressure and oxygen levels will be monitored. AFTER THE PROCEDURE   Your heart rate and blood pressure will be monitored after the test.  You may return to your normal schedule, including  diet,activities, and medicines, unless your health care provider tells you otherwise. This information is not intended to replace advice given to you by your health care provider. Make sure you discuss any questions you have with your health care provider. Document Released: 02/21/2009 Document Revised: 10/10/2013 Document Reviewed: 06/12/2013 Elsevier Interactive Patient Education  2017 [employer].

## 2016-11-27 ENCOUNTER — Telehealth: Payer: Self-pay | Admitting: *Deleted

## 2016-11-27 ENCOUNTER — Telehealth: Payer: Self-pay | Admitting: Cardiology

## 2016-11-27 NOTE — Pre-Procedure Instructions (Signed)
Reviewed 11/26/16 notes from Dr. Luanna Cole Cardiologist, further cardiac testing needed before clearance can be attained.

## 2016-11-27 NOTE — Telephone Encounter (Signed)
Notified patient of appt with Dr. Grayland Ormond for Tuesday 12/01/16 at 8:30am and to continue to avoid taking nsaids for pain.

## 2016-11-27 NOTE — Telephone Encounter (Signed)
Left voicemail message for patient to call back.

## 2016-11-27 NOTE — Telephone Encounter (Signed)
[facility] nuclear department calling to let us know patient called them to cancel his MYOVIEW for Tuesday stating his cancer doctor told him to cancel it that his apportionment as needed first.

## 2016-11-27 NOTE — Pre-Procedure Instructions (Signed)
Met B and CBC sent to Anesthesia and Dr. Erlene Quan for review.

## 2016-11-29 DIAGNOSIS — C3491 Malignant neoplasm of unspecified part of right bronchus or lung: Secondary | ICD-10-CM | POA: Insufficient documentation

## 2016-11-29 NOTE — Progress Notes (Signed)
[place]  [phone]) [phone] Fax:(336) [phone]  ID: Randall Hiss OB: [birth year]  MR#: [id number]  ENI#:[id number]  Patient Care Team: Albina Billet, MD as PCP - General (Internal Medicine)  CHIEF COMPLAINT: Right upper lobe lung mass.  INTERVAL HISTORY: Patient is a 76 year old male who had a chest x-ray and CT scan as workup for right flank and shoulder pain. CT scan revealed a right upper lobe mass highly suspicious for underlying malignancy. Other than patient's pain, patient feels well. He has no neurologic complaints. He denies any recent fevers or illnesses. He has a good appetite and denies weight loss. He denies any shortness of breath, cough, or hemoptysis. He denies any nausea, vomiting, constipation, or diarrhea. He has no melena or hematochezia. He has no urinary complaints. Patient otherwise feels well and offers no further specific complaints.  REVIEW OF SYSTEMS:   Review of Systems  Constitutional: Negative.  Negative for fever, malaise/fatigue and weight loss.  Respiratory: Negative.  Negative for cough and shortness of breath.   Cardiovascular: Positive for chest pain. Negative for leg swelling.  Gastrointestinal: Negative.  Negative for abdominal pain.  Genitourinary: Positive for flank pain.  Neurological: Negative.  Negative for sensory change and weakness.  Psychiatric/Behavioral: The patient is nervous/anxious.     As per HPI. Otherwise, a complete review of systems is negative.  PAST MEDICAL HISTORY: Past Medical History:  Diagnosis Date  . Basal cell carcinoma of skin   . Bladder cancer ([employer])   . COPD (chronic obstructive pulmonary disease) ([place])   . Diabetes mellitus without complication ([place])   . Dyspnea    with exertion  . Hyperlipidemia   . Hypertension   . Kidney stone   . Sleep apnea   . Squamous cell skin cancer     PAST SURGICAL HISTORY: Past Surgical History:  Procedure Laterality Date  . BLADDER FULGURATION      . HERNIA REPAIR      FAMILY HISTORY: Family History  Problem Relation Age of Onset  . Breast cancer Mother   . Stroke Father   . Diabetes Father   . Prostate cancer Neg Hx   . Bladder Cancer Neg Hx   . Kidney cancer Neg Hx     ADVANCED DIRECTIVES (Y/N):  N  HEALTH MAINTENANCE: Social History  Substance Use Topics  . Smoking status: Former Smoker    Years: 60.00  . Smokeless tobacco: Current User    Types: Snuff  . Alcohol use No     Colonoscopy:  PAP:  Bone density:  Lipid panel:  No Known Allergies  Current Outpatient Prescriptions  Medication Sig Dispense Refill  . albuterol (PROVENTIL HFA;VENTOLIN HFA) 108 (90 Base) MCG/ACT inhaler Inhale 1-2 puffs into the lungs every 6 (six) hours as needed for wheezing or shortness of breath.     Marland Kitchen albuterol (PROVENTIL) (2.5 MG/3ML) 0.083% nebulizer solution Take 2.5 mg by nebulization every 4 (four) hours as needed for wheezing or shortness of breath.     . budesonide-formoterol (SYMBICORT) 80-4.5 MCG/ACT inhaler Inhale 2 puffs into the lungs 2 (two) times daily.     . Cholecalciferol (VITAMIN D) 2000 units CAPS Take 2,000 Units by mouth daily.    . colchicine 0.6 MG tablet Take 0.6 mg by mouth daily.    Marland Kitchen glimepiride (AMARYL) 2 MG tablet Take 2 mg by mouth daily.     . Ibuprofen-Diphenhydramine Cit (ADVIL PM PO) Take 2 tablets by mouth at bedtime.    Marland Kitchen lisinopril-hydrochlorothiazide (PRINZIDE,ZESTORETIC)  20-12.5 MG tablet Take 1 tablet by mouth every evening.    . metFORMIN (GLUCOPHAGE-XR) 500 MG 24 hr tablet Take 1,000 mg by mouth 2 (two) times daily.     . Omega-3 Fatty Acids (FISH OIL) 1200 MG CAPS Take 1,200 mg by mouth daily.    . OXYGEN Inhale 2 L into the lungs continuous.    . tamsulosin (FLOMAX) 0.4 MG CAPS capsule TAKE 1 TABLET BY MOUTH EVERY NIGHT BEFORE BEDTIME    . tiotropium (SPIRIVA HANDIHALER) 18 MCG inhalation capsule Place 18 mcg into inhaler and inhale at bedtime.     . traMADol (ULTRAM) 50 MG tablet Take  50-100 mg by mouth 2 (two) times daily as needed (for pain.).    Marland Kitchen aspirin 81 MG chewable tablet TAKE 1 TABLET (81 MG) BY MOUTH AT NIGHT    . oxyCODONE-acetaminophen (PERCOCET/ROXICET) 5-325 MG tablet Take 1 tablet by mouth every 6 (six) hours as needed for severe pain. 60 tablet 0  . simvastatin (ZOCOR) 40 MG tablet Take 40 mg by mouth daily.      No current facility-administered medications for this visit.     OBJECTIVE: Vitals:   12/01/16 0835  BP: 127/65  Pulse: 83  Resp: 18  Temp: 98.8 F (37.1 C)     Body mass index is 28.51 kg/m.    ECOG FS:1 - Symptomatic but completely ambulatory  General: Well-developed, well-nourished, no acute distress. Eyes: Pink conjunctiva, anicteric sclera. HEENT: Normocephalic, moist mucous membranes, clear oropharnyx. Lungs: Clear to auscultation bilaterally. Heart: Regular rate and rhythm. No rubs, murmurs, or gallops. Abdomen: Soft, nontender, nondistended. No organomegaly noted, normoactive bowel sounds. Musculoskeletal: No edema, cyanosis, or clubbing. Neuro: Alert, answering all questions appropriately. Cranial nerves grossly intact. Skin: No rashes or petechiae noted. Psych: Normal affect. Lymphatics: No cervical, calvicular, axillary or inguinal LAD.   LAB RESULTS:  Lab Results  Component Value Date   NA 140 11/20/2016   K 3.9 11/20/2016   CL 96 (L) 11/20/2016   CO2 34 (H) 11/20/2016   GLUCOSE 90 11/20/2016   BUN 28 (H) 11/20/2016   CREATININE 1.20 11/23/2016   CALCIUM 9.7 11/20/2016   GFRNONAA >60 11/20/2016   GFRAA >60 11/20/2016    Lab Results  Component Value Date   WBC 11.7 (H) 11/20/2016   NEUTROABS 7.5 (H) 06/26/2014   HGB 9.5 (L) 11/20/2016   HCT 29.6 (L) 11/20/2016   MCV 82.8 11/20/2016   PLT 403 11/20/2016     STUDIES: Dg Chest 2 View  Result Date: 11/16/2016 CLINICAL DATA:  Pain in Rt chest wall to lateral chest and rt armpit that radiates to rt elbow and wraps around his chest to xiphoid and mid chest  area and also around his posterior chest wall to midline, he states that he feels lumps under the skin and there is swelling all in this area since last night, H/o COPD, former smoker, on O2 all the time, SHOB, white productive cough for over a month, treated with antibiotics initially and no help, no other lung/heart history or surgery per pt., shielded EXAM: CHEST - 2 VIEW COMPARISON:  CT 12/12/2007 and previous FINDINGS: Pleural-based masslike density laterally in the right lung apex, measuring at least 6.3 cm. Indistinctness of the cortex of the lateral aspect right second rib suggesting chest wall involvement. Coarse interstitial markings in both lung bases. Left lung remains clear. Heart size upper limits normal.  Atheromatous aorta. No effusion. Minimal spurring in the mid thoracic spine. IMPRESSION: 1. Pleural-based  right apical mass with possible chest wall involvement. Considerations include invasive neoplasm versus atypical pneumonia. Recommend CT chest with contrast for further characterization. Electronically Signed   By: Lucrezia Europe M.D.   On: 11/16/2016 15:25   Ct Chest W Contrast  Result Date: 11/23/2016 CLINICAL DATA:  Right upper chest pain. EXAM: CT CHEST WITH CONTRAST TECHNIQUE: Multidetector CT imaging of the chest was performed during intravenous contrast administration. CONTRAST:  34m ISOVUE-300 IOPAMIDOL (ISOVUE-300) INJECTION 61% COMPARISON:  Chest x-ray 11/16/2016.  Chest CT 01/09/2008 FINDINGS: Cardiovascular: Heart is normal size. Diffuse coronary artery and aortic calcifications. No aneurysm. Mediastinum/Nodes: Mediastinal adenopathy. Subcarinal lymph node has a short axis diameter of 16 mm on image 80. AP window lymph node has a short axis diameter of 10 mm. High right paratracheal lymph node has a short axis diameter of 11 mm. Other similarly sized mediastinal lymph nodes. Necrotic right hilar lymph node has a short axis diameter of 17 mm on image 63. No axillary or left hilar  adenopathy. Lungs/Pleura: Large masslike airspace opacity noted in the right upper lobe a measures 6.1 x 5.3 cm. There is chest wall invasion laterally and involvement of the lateral low right second rib. Central necrosis and cavitation noted. Satellite nodule noted in the right upper lobe inferiorly measures 15 mm on image 53. There is airspace disease in both lower lobes as well as the posterior left upper lobe. This most likely represents multifocal pneumonia, less likely lymphangitic spread of tumor. Mild emphysema. No pleural effusions. Upper Abdomen: Gallstones within the gallbladder. No acute findings in the upper abdomen. Musculoskeletal: Chest wall soft tissues are unremarkable. No acute bony abnormality. IMPRESSION: Right upper lobe mass with central necrosis and cavitation. Lateral chest wall invasion and involvement/destruction of the lateral right second rib. Findings compatible with primary lung cancer. 15 mm satellite nodule in the right upper lobe. Associated right hilar and mediastinal adenopathy. Nodular airspace disease throughout the lower lobes and posterior left upper lobe, most likely pneumonia. Lymphangitic spread of tumor is felt less likely but not excluded. Coronary artery disease, aortic atherosclerosis. Cholelithiasis. Electronically Signed   By: KRolm BaptiseM.D.   On: 11/23/2016 08:35    ASSESSMENT: Right upper lobe lung mass.  PLAN:    1. Right upper lobe lung mass: CT scan results reviewed independently and reported as above highly suspicious for underlying malignancy. Patient will require a PET scan as well as a CT-guided biopsy for staging purposes and to determine a diagnosis. He will return to clinic one week after his biopsy to discuss the results and treatment planning. 2. Flank pain: Likely secondary to lateral chest wall invasion of lung mass. Patient was given a prescription for Percocet today. He will likely benefit from palliative XRT in the near  future.  Approximately 45 minutes was spent in discussion of which greater than 50% was consultation.  Patient expressed understanding and was in agreement with this plan. He also understands that He can call clinic at any time with any questions, concerns, or complaints.   Cancer Staging No matching staging information was found for the patient.  TLloyd Huger MD   12/02/2016 1:52 PM

## 2016-11-30 NOTE — Telephone Encounter (Signed)
Spoke with patient and he states that they told him to hold off on testing for now. He is meeting with them tomorrow and will see what they recommend before rescheduling at this time. Let him know to call back if I can assist in any way. He was appreciative for the call and had no further questions at this time.

## 2016-12-01 ENCOUNTER — Inpatient Hospital Stay: Payer: Medicare Other | Attending: Oncology | Admitting: Oncology

## 2016-12-01 ENCOUNTER — Encounter: Payer: Self-pay | Admitting: Oncology

## 2016-12-01 VITALS — BP 127/65 | HR 83 | Temp 98.8°F | Resp 18 | Ht 65.0 in | Wt 171.3 lb

## 2016-12-01 DIAGNOSIS — R918 Other nonspecific abnormal finding of lung field: Secondary | ICD-10-CM | POA: Insufficient documentation

## 2016-12-01 DIAGNOSIS — G4730 Sleep apnea, unspecified: Secondary | ICD-10-CM | POA: Diagnosis not present

## 2016-12-01 DIAGNOSIS — Z8551 Personal history of malignant neoplasm of bladder: Secondary | ICD-10-CM | POA: Diagnosis not present

## 2016-12-01 DIAGNOSIS — Z9981 Dependence on supplemental oxygen: Secondary | ICD-10-CM | POA: Insufficient documentation

## 2016-12-01 DIAGNOSIS — Z87442 Personal history of urinary calculi: Secondary | ICD-10-CM | POA: Insufficient documentation

## 2016-12-01 DIAGNOSIS — E785 Hyperlipidemia, unspecified: Secondary | ICD-10-CM | POA: Diagnosis not present

## 2016-12-01 DIAGNOSIS — Z7982 Long term (current) use of aspirin: Secondary | ICD-10-CM | POA: Diagnosis not present

## 2016-12-01 DIAGNOSIS — R109 Unspecified abdominal pain: Secondary | ICD-10-CM | POA: Diagnosis not present

## 2016-12-01 DIAGNOSIS — J449 Chronic obstructive pulmonary disease, unspecified: Secondary | ICD-10-CM | POA: Diagnosis not present

## 2016-12-01 DIAGNOSIS — Z7984 Long term (current) use of oral hypoglycemic drugs: Secondary | ICD-10-CM | POA: Diagnosis not present

## 2016-12-01 DIAGNOSIS — Z79899 Other long term (current) drug therapy: Secondary | ICD-10-CM | POA: Insufficient documentation

## 2016-12-01 DIAGNOSIS — I10 Essential (primary) hypertension: Secondary | ICD-10-CM | POA: Diagnosis not present

## 2016-12-01 DIAGNOSIS — E119 Type 2 diabetes mellitus without complications: Secondary | ICD-10-CM | POA: Insufficient documentation

## 2016-12-01 DIAGNOSIS — Z85828 Personal history of other malignant neoplasm of skin: Secondary | ICD-10-CM | POA: Insufficient documentation

## 2016-12-01 DIAGNOSIS — Z87891 Personal history of nicotine dependence: Secondary | ICD-10-CM | POA: Insufficient documentation

## 2016-12-01 MED ORDER — OXYCODONE-ACETAMINOPHEN 5-325 MG PO TABS: 1.0000 | ORAL_TABLET | Freq: Four times a day (QID) | ORAL | 0 refills | Status: DC | PRN

## 2016-12-01 NOTE — Telephone Encounter (Signed)
Noted. As mentioned in my note, I have that discussion with the referring physician, Dr. Erlene Quan, the urologist. She said that most likely patient will still need the bladder surgery, and hence wanted the preop done. We will then await call back from the patient or his oncologist regarding proceeding with this test. Thank you.

## 2016-12-01 NOTE — Progress Notes (Signed)
Patient is here for new patient appointment.  

## 2016-12-02 ENCOUNTER — Ambulatory Visit: Admission: RE | Admit: 2016-12-02 | Payer: Medicare Other | Source: Ambulatory Visit | Admitting: Urology

## 2016-12-02 ENCOUNTER — Encounter: Admission: RE | Payer: Self-pay | Source: Ambulatory Visit

## 2016-12-02 ENCOUNTER — Ambulatory Visit: Payer: Medicare Other | Admitting: Oncology

## 2016-12-02 SURGERY — TRANSURETHRAL RESECTION OF BLADDER TUMOR WITH MITOMYCIN-C
Anesthesia: Choice

## 2016-12-03 ENCOUNTER — Other Ambulatory Visit: Payer: Medicare Other

## 2016-12-07 ENCOUNTER — Ambulatory Visit
Admission: RE | Admit: 2016-12-07 | Discharge: 2016-12-07 | Disposition: A | Discharge status: Home / Self Care | Payer: Medicare Other | Source: Ambulatory Visit | Attending: Oncology | Admitting: Oncology

## 2016-12-07 DIAGNOSIS — C7951 Secondary malignant neoplasm of bone: Secondary | ICD-10-CM | POA: Diagnosis not present

## 2016-12-07 DIAGNOSIS — C778 Secondary and unspecified malignant neoplasm of lymph nodes of multiple regions: Secondary | ICD-10-CM | POA: Diagnosis not present

## 2016-12-07 DIAGNOSIS — R918 Other nonspecific abnormal finding of lung field: Secondary | ICD-10-CM | POA: Insufficient documentation

## 2016-12-07 LAB — GLUCOSE, CAPILLARY: GLUCOSE-CAPILLARY: 92 mg/dL (ref 65–99)

## 2016-12-07 MED ORDER — FLUDEOXYGLUCOSE F - 18 (FDG) INJECTION
12.0000 | Freq: Once | INTRAVENOUS | Status: AC | PRN
  Administered 2016-12-07: 12.32 via INTRAVENOUS

## 2016-12-08 ENCOUNTER — Telehealth: Payer: Self-pay | Admitting: *Deleted

## 2016-12-08 ENCOUNTER — Ambulatory Visit: Payer: Medicare Other | Admitting: Oncology

## 2016-12-08 ENCOUNTER — Ambulatory Visit: Payer: Medicare Other

## 2016-12-08 ENCOUNTER — Other Ambulatory Visit: Payer: Medicare Other

## 2016-12-08 ENCOUNTER — Other Ambulatory Visit: Payer: Self-pay | Admitting: *Deleted

## 2016-12-08 DIAGNOSIS — R918 Other nonspecific abnormal finding of lung field: Secondary | ICD-10-CM

## 2016-12-08 NOTE — Telephone Encounter (Signed)
Pt aware of PET results. Appointment for US guided biopsy and follow up with Dr. Grayland Ormond were reviewed with the patient. Pt verbalized understanding.

## 2016-12-08 NOTE — Telephone Encounter (Signed)
-----   Message from Lloyd Huger, MD sent at 12/08/2016  2:34 PM EST ----- Regarding: RE: pet results PET scan did not reveal any surprises. Case discussed with interventional radiology and he thought the biopsy of the lymph node was safer and give the same answer.   ----- Message ----- From: Telford Nab, RN Sent: 12/08/2016   1:43 PM To: Lloyd Huger, MD Subject: FW: pet results                                Please advise on what you want me to tell the patient. Interventional radiologist wanted to biopsy a enlarged lymph node of the right neck instead of lung mass, just FYI.  ----- Message ----- From: Hazeline Junker Sent: 12/08/2016   1:19 PM To: [facility], RN Subject: pet results                                    Patient would like to know if we have pet results from 2/19, if so please call pt asap. Thanks!

## 2016-12-11 ENCOUNTER — Other Ambulatory Visit: Payer: Self-pay | Admitting: Radiology

## 2016-12-11 ENCOUNTER — Telehealth: Payer: Self-pay | Admitting: *Deleted

## 2016-12-11 NOTE — Telephone Encounter (Signed)
Pt's wife called to ask if patient would be sedated for biopsy. Informed [name] [name] that he most likely would not be sedated but the area being biopsied would be numb prior procedure.   Pt stated that his current pain medication is not helping relieve pain. He states is taking percocet q6hrs along with his tramadol. Informed pt to try taking percocet every 4 hours to see if that helps and if it doesn't then to call back for further directions. Pt verbalized understanding.

## 2016-12-14 ENCOUNTER — Ambulatory Visit
Admission: RE | Admit: 2016-12-14 | Discharge: 2016-12-14 | Disposition: A | Discharge status: Home / Self Care | Payer: Medicare Other | Source: Ambulatory Visit | Attending: Oncology | Admitting: Oncology

## 2016-12-14 ENCOUNTER — Other Ambulatory Visit: Payer: Self-pay | Admitting: Radiology

## 2016-12-14 DIAGNOSIS — E119 Type 2 diabetes mellitus without complications: Secondary | ICD-10-CM | POA: Insufficient documentation

## 2016-12-14 DIAGNOSIS — Z79899 Other long term (current) drug therapy: Secondary | ICD-10-CM | POA: Insufficient documentation

## 2016-12-14 DIAGNOSIS — I10 Essential (primary) hypertension: Secondary | ICD-10-CM | POA: Insufficient documentation

## 2016-12-14 DIAGNOSIS — Z7984 Long term (current) use of oral hypoglycemic drugs: Secondary | ICD-10-CM | POA: Diagnosis not present

## 2016-12-14 DIAGNOSIS — Z9981 Dependence on supplemental oxygen: Secondary | ICD-10-CM | POA: Diagnosis not present

## 2016-12-14 DIAGNOSIS — E785 Hyperlipidemia, unspecified: Secondary | ICD-10-CM | POA: Insufficient documentation

## 2016-12-14 DIAGNOSIS — Z87891 Personal history of nicotine dependence: Secondary | ICD-10-CM | POA: Diagnosis not present

## 2016-12-14 DIAGNOSIS — Z7982 Long term (current) use of aspirin: Secondary | ICD-10-CM | POA: Insufficient documentation

## 2016-12-14 DIAGNOSIS — C7951 Secondary malignant neoplasm of bone: Secondary | ICD-10-CM | POA: Diagnosis not present

## 2016-12-14 DIAGNOSIS — C770 Secondary and unspecified malignant neoplasm of lymph nodes of head, face and neck: Secondary | ICD-10-CM | POA: Insufficient documentation

## 2016-12-14 DIAGNOSIS — Z8551 Personal history of malignant neoplasm of bladder: Secondary | ICD-10-CM | POA: Insufficient documentation

## 2016-12-14 DIAGNOSIS — R918 Other nonspecific abnormal finding of lung field: Secondary | ICD-10-CM

## 2016-12-14 DIAGNOSIS — G4730 Sleep apnea, unspecified: Secondary | ICD-10-CM | POA: Diagnosis not present

## 2016-12-14 DIAGNOSIS — C3490 Malignant neoplasm of unspecified part of unspecified bronchus or lung: Secondary | ICD-10-CM | POA: Insufficient documentation

## 2016-12-14 DIAGNOSIS — J449 Chronic obstructive pulmonary disease, unspecified: Secondary | ICD-10-CM | POA: Diagnosis not present

## 2016-12-14 DIAGNOSIS — Z85828 Personal history of other malignant neoplasm of skin: Secondary | ICD-10-CM | POA: Insufficient documentation

## 2016-12-14 HISTORY — DX: Pneumonia, unspecified organism: J18.9

## 2016-12-14 HISTORY — DX: Personal history of urinary calculi: Z87.442

## 2016-12-14 HISTORY — DX: Unspecified osteoarthritis, unspecified site: M19.90

## 2016-12-14 LAB — GLUCOSE, CAPILLARY: GLUCOSE-CAPILLARY: 88 mg/dL (ref 65–99)

## 2016-12-14 MED ORDER — HYDROCODONE-ACETAMINOPHEN 5-325 MG PO TABS: 1.0000 | ORAL_TABLET | ORAL | Status: DC | PRN

## 2016-12-14 MED ORDER — SODIUM CHLORIDE 0.9 % IV SOLN
INTRAVENOUS | Status: DC
  Administered 2016-12-14: 11:00:00 via INTRAVENOUS

## 2016-12-14 MED ORDER — FENTANYL CITRATE (PF) 100 MCG/2ML IJ SOLN
INTRAMUSCULAR | Status: AC | PRN
  Administered 2016-12-14 (×2): 25 ug via INTRAVENOUS

## 2016-12-14 MED ORDER — MIDAZOLAM HCL 5 MG/5ML IJ SOLN
INTRAMUSCULAR | Status: AC | PRN
  Administered 2016-12-14: 1 mg via INTRAVENOUS
  Administered 2016-12-14: 0.5 mg via INTRAVENOUS

## 2016-12-14 NOTE — H&P (Signed)
Chief Complaint: Patient was seen in consultation today for right supraclavicular lymph node biopsy at the request of Finnegan,Timothy J  Referring Physician(s): Finnegan,Timothy J  Patient Status: [facility] - Out-pt  History of Present Illness: [name] [name] is a 76 y.o. 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  Currently with right chest wall and arm pain.  Past Medical History:  Diagnosis Date  . Arthritis   . Basal cell carcinoma of skin   . Bladder cancer ([place])   . COPD (chronic obstructive pulmonary disease) ([place])   . Diabetes mellitus without complication ([place])   . Dyspnea    with exertion  . History of kidney stones   . Hyperlipidemia   . Hypertension   . Kidney stone   . Pneumonia   . Sleep apnea   . Squamous cell skin cancer     Past Surgical History:  Procedure Laterality Date  . BLADDER FULGURATION    . HERNIA REPAIR      Allergies: Patient has no known allergies.  Medications: Prior to Admission medications   Medication Sig Start Date End Date Taking? Authorizing Provider  albuterol (PROVENTIL HFA;VENTOLIN HFA) 108 (90 Base) MCG/ACT inhaler Inhale 1-2 puffs into the lungs every 6 (six) hours as needed for wheezing or shortness of breath.    Yes Historical Provider, MD  albuterol (PROVENTIL) (2.5 MG/3ML) 0.083% nebulizer solution Take 2.5 mg by nebulization every 4 (four) hours as needed for wheezing or shortness of breath.  03/20/13  Yes Historical Provider, MD  aspirin 81 MG chewable tablet TAKE 1 TABLET (81 MG) BY MOUTH AT NIGHT   Yes Historical Provider, MD  budesonide-formoterol (SYMBICORT) 80-4.5 MCG/ACT inhaler Inhale 2 puffs into the lungs 2 (two) times daily.  03/05/13  Yes Historical Provider, MD  Cholecalciferol (VITAMIN D) 2000 units CAPS Take 2,000 Units by mouth daily.   Yes Historical Provider, MD   colchicine 0.6 MG tablet Take 0.6 mg by mouth daily.   Yes Historical Provider, MD  glimepiride (AMARYL) 2 MG tablet Take 2 mg by mouth daily.  01/03/14  Yes Historical Provider, MD  lisinopril-hydrochlorothiazide (PRINZIDE,ZESTORETIC) 20-12.5 MG tablet Take 1 tablet by mouth every evening.   Yes Historical Provider, MD  metFORMIN (GLUCOPHAGE-XR) 500 MG 24 hr tablet Take 1,000 mg by mouth 2 (two) times daily.  02/17/13  Yes Historical Provider, MD  Omega-3 Fatty Acids (FISH OIL) 1200 MG CAPS Take 1,200 mg by mouth daily.   Yes Historical Provider, MD  oxyCODONE-acetaminophen (PERCOCET/ROXICET) 5-325 MG tablet Take 1 tablet by mouth every 6 (six) hours as needed for severe pain. 12/01/16 12/16/16 Yes Lloyd Huger, MD  OXYGEN Inhale 2 L into the lungs continuous.   Yes Historical Provider, MD  tamsulosin (FLOMAX) 0.4 MG CAPS capsule TAKE 1 TABLET BY MOUTH EVERY NIGHT BEFORE BEDTIME 01/06/16  Yes Historical Provider, MD  tiotropium (SPIRIVA HANDIHALER) 18 MCG inhalation capsule Place 18 mcg into inhaler and inhale at bedtime.  03/20/13  Yes Historical Provider, MD  traMADol (ULTRAM) 50 MG tablet Take 50-100 mg by mouth 2 (two) times daily as needed (for pain.).   Yes Historical Provider, MD  Ibuprofen-Diphenhydramine Cit (ADVIL PM PO) Take 2 tablets by mouth at bedtime.    Historical Provider, MD  simvastatin (ZOCOR) 40 MG tablet Take 40 mg by mouth daily.  02/17/13   Historical Provider, MD  Family History  Problem Relation Age of Onset  . Breast cancer Mother   . Stroke Father   . Diabetes Father   . Prostate cancer Neg Hx   . Bladder Cancer Neg Hx   . Kidney cancer Neg Hx     Social History   Social History  . Marital status: Married    Spouse name: N/A  . Number of children: N/A  . Years of education: N/A   Social History Main Topics  . Smoking status: Former Smoker    Years: 60.00    Quit date: 12/14/2013  . Smokeless tobacco: Current User    Types: Snuff  . Alcohol use No  .  Drug use: No  . Sexual activity: Not Asked   Other Topics Concern  . None   Social History Narrative  . None    ECOG Status: 1 - Symptomatic but completely ambulatory  Review of Systems: A 12 point ROS discussed and pertinent positives are indicated in the HPI above.  All other systems are negative.  Review of Systems  Constitutional: Negative.   HENT: Negative.   Respiratory: Positive for shortness of breath.        Some chronic shortness of breath.  Cardiovascular: Negative.   Gastrointestinal: Negative.   Genitourinary: Negative.   Musculoskeletal:       Right arm pain.  Neurological: Negative.     Vital Signs: BP (!) 130/52   Pulse 78   Temp 98.9 F (37.2 C) (Oral)   Resp (!) 21   SpO2 95%   Physical Exam  Constitutional: He is oriented to person, place, and time. No distress.  HENT:  Head: Normocephalic and atraumatic.  Neck: Neck supple. No JVD present.  Cardiovascular: Normal rate, regular rhythm and normal heart sounds.  Exam reveals no gallop.   No murmur heard. Pulmonary/Chest: No respiratory distress. He has wheezes. He has rales.  Bilateral expiratory wheezes and bibasilar rales.  Abdominal: Soft. Bowel sounds are normal. He exhibits no distension and no mass. There is no tenderness. There is no rebound and no guarding.  Musculoskeletal: He exhibits no edema.  Neurological: He is alert and oriented to person, place, and time.  Skin: He is not diaphoretic.  Vitals reviewed.   Mallampati Score:  MD Evaluation Airway: WNL Heart: WNL Abdomen: WNL Chest/ Lungs: WNL ASA  Classification: 3 Mallampati/Airway Score: One  Imaging: Dg Chest 2 View  Result Date: 11/16/2016 CLINICAL DATA:  Pain in Rt chest wall to lateral chest and rt armpit that radiates to rt elbow and wraps around his chest to xiphoid and mid chest area and also around his posterior chest wall to midline, he states that he feels lumps under the skin and there is swelling all in this  area since last night, H/o COPD, former smoker, on O2 all the time, SHOB, white productive cough for over a month, treated with antibiotics initially and no help, no other lung/heart history or surgery per pt., shielded EXAM: CHEST - 2 VIEW COMPARISON:  CT 12/12/2007 and previous FINDINGS: Pleural-based masslike density laterally in the right lung apex, measuring at least 6.3 cm. Indistinctness of the cortex of the lateral aspect right second rib suggesting chest wall involvement. Coarse interstitial markings in both lung bases. Left lung remains clear. Heart size upper limits normal.  Atheromatous aorta. No effusion. Minimal spurring in the mid thoracic spine. IMPRESSION: 1. Pleural-based right apical mass with possible chest wall involvement. Considerations include invasive neoplasm versus atypical pneumonia. Recommend CT  chest with contrast for further characterization. Electronically Signed   By: Lucrezia Europe M.D.   On: 11/16/2016 15:25   Ct Chest W Contrast  Result Date: 11/23/2016 CLINICAL DATA:  Right upper chest pain. EXAM: CT CHEST WITH CONTRAST TECHNIQUE: Multidetector CT imaging of the chest was performed during intravenous contrast administration. CONTRAST:  64m ISOVUE-300 IOPAMIDOL (ISOVUE-300) INJECTION 61% COMPARISON:  Chest x-ray 11/16/2016.  Chest CT 01/09/2008 FINDINGS: Cardiovascular: Heart is normal size. Diffuse coronary artery and aortic calcifications. No aneurysm. Mediastinum/Nodes: Mediastinal adenopathy. Subcarinal lymph node has a short axis diameter of 16 mm on image 80. AP window lymph node has a short axis diameter of 10 mm. High right paratracheal lymph node has a short axis diameter of 11 mm. Other similarly sized mediastinal lymph nodes. Necrotic right hilar lymph node has a short axis diameter of 17 mm on image 63. No axillary or left hilar adenopathy. Lungs/Pleura: Large masslike airspace opacity noted in the right upper lobe a measures 6.1 x 5.3 cm. There is chest wall invasion  laterally and involvement of the lateral low right second rib. Central necrosis and cavitation noted. Satellite nodule noted in the right upper lobe inferiorly measures 15 mm on image 53. There is airspace disease in both lower lobes as well as the posterior left upper lobe. This most likely represents multifocal pneumonia, less likely lymphangitic spread of tumor. Mild emphysema. No pleural effusions. Upper Abdomen: Gallstones within the gallbladder. No acute findings in the upper abdomen. Musculoskeletal: Chest wall soft tissues are unremarkable. No acute bony abnormality. IMPRESSION: Right upper lobe mass with central necrosis and cavitation. Lateral chest wall invasion and involvement/destruction of the lateral right second rib. Findings compatible with primary lung cancer. 15 mm satellite nodule in the right upper lobe. Associated right hilar and mediastinal adenopathy. Nodular airspace disease throughout the lower lobes and posterior left upper lobe, most likely pneumonia. Lymphangitic spread of tumor is felt less likely but not excluded. Coronary artery disease, aortic atherosclerosis. Cholelithiasis. Electronically Signed   By: KRolm BaptiseM.D.   On: 11/23/2016 08:35   Nm Pet Image Initial (pi) Skull Base To Thigh  Result Date: 12/07/2016 CLINICAL DATA:  Evaluation of a right upper lobe mass. EXAM: NUCLEAR MEDICINE PET SKULL BASE TO THIGH TECHNIQUE: 12.32 mCi F-18 FDG was injected intravenously. Full-ring PET imaging was performed from the skull base to thigh after the radiotracer. CT data was obtained and used for attenuation correction and anatomic localization. FASTING BLOOD GLUCOSE:  Value: 92 mg/dl COMPARISON:  CT scan of the abdomen October 07, 2016. CT scan of the chest November 23, 2016 FINDINGS: NECK Muscular uptake is seen in the neck bilaterally, right greater than left. Metastatic nodes are seen in the base of the neck on the right. At least 3 discrete nodes are identified with the largest  seen on CT image series 3, image 65 measuring 15 by 12 mm with a maximum SUV of 17.8. CHEST The dominant mass in the right upper lobe with invasion into the chest wall and erosion of the adjacent second rib is again identified. The mass measures 6.5 by 5.4 cm on CT image 87 with a maximum SUV of 28.5 and central necrosis. Just inferior to the dominant mass is nodularity extending from the hilum to the periphery of the right lung as seen on CT images 95 through 102. This nodularity is all FDG avid. Small nodes in the anterior right lung on CT images110 demonstrate mild FDG uptake. A small nodule in  the posterior right lung on image 113 demonstrates mild FDG uptake. The largest nodule in the left in the lingula on image 110 demonstrates abnormal FDG uptake with a maximum SUV of 4.7. This nodule measures 11 by 8.4 mm. Other opacities posteriorly within the lungs have an infectious appearance. There is a soft tissue nodule in the subpleural fat medially in the right apex on CT image 76 which is FDG avid. There is mild uptake in the region of the manubrium on image 90 which is suspicious. It is unclear whether this is bony or soft tissue in origin. There is abnormal uptake in T1 which is consistent with metastatic disease. An MRI could better assess. Extension into the canal is not excluded on PET imaging. No other mediastinal or hilar disease identified. There is a subcutaneous nodule deep to the right latissimus dorsi on CT image 136 which is FDG avid. ABDOMEN/PELVIS A subcutaneous nodule on the right lateral abdominal wall on CT image 160 is mildly FDG avid. There is mild uptake in the left adrenal gland, asymmetric to the right, with no CT correlate. Uptake throughout the colon is likely physiologic. There is uptake in the distal left ureter which is likely normal. Uptake in the right paraspinous musculature on image 183 is nonspecific. Recommend attention on follow-up. There is uptake in the left gluteus muscle on  image wall 271 with a maximum SUV of 5. SKELETON Abnormal uptake mid T1 vertebral body with no CT correlate. Bony destruction of the right second rib. IMPRESSION: 1. The mass in the right upper lobe is FDG avid, consistent with malignancy. The mass invades the chest wall and erodes the second rib. Bilateral pulmonary nodules are FDG avid concerning for metastatic disease. Metastatic nodes in the right side of the neck are noted. Metastatic disease to T1 is identified. An MRI could better assess extent of the T1 metastatic lesion. Canal extension not excluded on PET imaging. Subcutaneous and muscular soft tissue nodules in the chest, abdomen, and gluteus region are all consistent with metastatic disease. There is mild focal uptake in the left adrenal gland with no CT correlate. This is indeterminate but an early metastatic lesion is not excluded. Recommend attention in this region on follow-up. There is a metastatic node in the right medial subpleural space and metastatic disease either in or adjacent to the right side of the manubrium. Electronically Signed   By: Dorise Bullion III M.D   On: 12/07/2016 14:58    Labs:  CBC:  Recent Labs  11/20/16 1106  WBC 11.7*  HGB 9.5*  HCT 29.6*  PLT 403    COAGS: No results for input(s): INR, APTT in the last 8760 hours.  BMP:  Recent Labs  10/06/16 1124 11/20/16 1106 11/23/16 0809  NA  --  140  --   K  --  3.9  --   CL  --  96*  --   CO2  --  34*  --   GLUCOSE  --  90  --   BUN 28* 28*  --   CALCIUM  --  9.7  --   CREATININE 1.08 0.99 1.20  GFRNONAA 66 >60  --   GFRAA 77 >60  --     Assessment and Plan:   For US guided right cervical/supraclavicular LN biopsy today with IV conscious sedation. Risks and benefits discussed with the patient including, but not limited to bleeding, infection, damage to adjacent structures or low yield requiring additional tests. All of the  patient's questions were answered, patient is agreeable to  proceed. Consent signed and in chart.  Thank you for this interesting consult.  I greatly enjoyed meeting Giuliano Preece and look forward to participating in their care.  A copy of this report was sent to the requesting provider on this date.  Electronically SignedAletta Edouard T 12/14/2016, 10:42 AM   I spent a total of 30 Minutes in face to face in clinical consultation, greater than 50% of which was counseling/coordinating care for right cervical lymph node biopsy.

## 2016-12-14 NOTE — Procedures (Signed)
Interventional Radiology Procedure Note  Procedure: US guided core biopsy of right supraclavicular lymph node  Complications: None  Estimated Blood Loss: < 10 mL  Ill-defined, abnormal appearing right supraclavicular lymph node by [place].  18 G core biopsy x 5.  Venetia Night. Kathlene Cote, M.D Pager:  [phone]

## 2016-12-14 NOTE — Progress Notes (Signed)
[id number].

## 2016-12-15 ENCOUNTER — Other Ambulatory Visit: Payer: Self-pay | Admitting: Pathology

## 2016-12-15 ENCOUNTER — Other Ambulatory Visit: Payer: Self-pay | Admitting: *Deleted

## 2016-12-15 MED ORDER — OXYCODONE-ACETAMINOPHEN 5-325 MG PO TABS: 1.0000 | ORAL_TABLET | ORAL | 0 refills | Status: DC | PRN

## 2016-12-15 NOTE — Telephone Encounter (Signed)
Left message on VM that rx is ready

## 2016-12-20 NOTE — Progress Notes (Signed)
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  INTERVAL HISTORY: Patient returns to clinic today for discussion of his imaging and biopsy results and treatment planning. He continues to have significant right flank and shoulder pain, but otherwise feels well. He has no neurologic complaints. He denies any recent fevers or illnesses. He has a good appetite and denies weight loss. He denies any shortness of breath, cough, or hemoptysis. He denies any nausea, vomiting, constipation, or diarrhea. He has no melena or hematochezia. He has no urinary complaints. Patient otherwise feels well and offers no further specific complaints.  REVIEW OF SYSTEMS:   Review of Systems  Constitutional: Negative.  Negative for fever, malaise/fatigue and weight loss.  Respiratory: Negative.  Negative for cough and shortness of breath.   Cardiovascular: Positive for chest pain. Negative for leg swelling.  Gastrointestinal: Negative.  Negative for abdominal pain.  Genitourinary: Positive for flank pain.  Neurological: Negative.  Negative for sensory change and weakness.  Psychiatric/Behavioral: The patient is nervous/anxious.     As per HPI. Otherwise, a complete review of systems is negative.  PAST MEDICAL HISTORY: Past Medical History:  Diagnosis Date  . Arthritis   . Basal cell carcinoma of skin   . Bladder cancer ([place])   . COPD (chronic obstructive pulmonary disease) ([place])   . Diabetes mellitus without complication ([place])   . Dyspnea    with exertion  . History of kidney stones   . Hyperlipidemia   . Hypertension   . Kidney stone   . Pneumonia   . Sleep apnea   . Squamous cell skin cancer     PAST SURGICAL HISTORY: Past Surgical  History:  Procedure Laterality Date  . BLADDER FULGURATION    . HERNIA REPAIR      FAMILY HISTORY: Family History  Problem Relation Age of Onset  . Breast cancer Mother   . Stroke Father   . Diabetes Father   . Prostate cancer Neg Hx   . Bladder Cancer Neg Hx   . Kidney cancer Neg Hx     ADVANCED DIRECTIVES (Y/N):  N  HEALTH MAINTENANCE: Social History  Substance Use Topics  . Smoking status: Former Smoker    Years: 60.00    Quit date: 12/14/2013  . Smokeless tobacco: Current User    Types: Snuff  . Alcohol use No     Colonoscopy:  PAP:  Bone density:  Lipid panel:  No Known Allergies  Current Outpatient Prescriptions  Medication Sig Dispense Refill  . albuterol (PROVENTIL HFA;VENTOLIN HFA) 108 (90 Base) MCG/ACT inhaler Inhale 1-2 puffs into the lungs every 6 (six) hours as needed for wheezing or shortness of breath.     Marland Kitchen albuterol (PROVENTIL) (2.5 MG/3ML) 0.083% nebulizer solution Take 2.5 mg by nebulization every 4 (four) hours as needed for wheezing or shortness of breath.     Marland Kitchen aspirin 81 MG chewable tablet TAKE 1 TABLET (81 MG) BY MOUTH AT NIGHT    . Cholecalciferol (VITAMIN D) 2000 units CAPS Take 2,000 Units by mouth daily.    . colchicine 0.6 MG tablet Take 0.6 mg by mouth daily.    Marland Kitchen glimepiride (AMARYL) 2 MG tablet Take 2 mg by mouth daily.     . metFORMIN (GLUCOPHAGE-XR) 500  MG 24 hr tablet Take 1,000 mg by mouth 2 (two) times daily.     . Omega-3 Fatty Acids (FISH OIL) 1200 MG CAPS Take 1,200 mg by mouth daily.    Marland Kitchen oxyCODONE-acetaminophen (PERCOCET/ROXICET) 5-325 MG tablet Take 1 tablet by mouth every 4 (four) hours as needed for severe pain. 90 tablet 0  . OXYGEN Inhale 2 L into the lungs continuous.    . simvastatin (ZOCOR) 40 MG tablet Take 40 mg by mouth daily.     . tamsulosin (FLOMAX) 0.4 MG CAPS capsule TAKE 1 TABLET BY MOUTH EVERY NIGHT BEFORE BEDTIME    . tiotropium (SPIRIVA HANDIHALER) 18 MCG inhalation capsule Place 18 mcg into inhaler and  inhale at bedtime.     . traMADol (ULTRAM) 50 MG tablet Take 50-100 mg by mouth every 6 (six) hours as needed (for pain.).     Marland Kitchen acetaminophen (TYLENOL) 500 MG tablet Take 500 mg by mouth every 6 (six) hours as needed for moderate pain or headache.    . diphenhydramine-acetaminophen (TYLENOL PM) 25-500 MG TABS tablet Take 1-2 tablets by mouth at bedtime as needed (sleep).    Marland Kitchen lisinopril (PRINIVIL,ZESTRIL) 40 MG tablet Take 40 mg by mouth daily.    . SYMBICORT 160-4.5 MCG/ACT inhaler Inhale 2 puffs into the lungs 2 (two) times daily.     No current facility-administered medications for this visit.     OBJECTIVE: Vitals:   12/21/16 1150  BP: 125/66  Pulse: 86  Resp: 18  Temp: 98.2 F (36.8 C)     Body mass index is 27.15 kg/m.    ECOG FS:1 - Symptomatic but completely ambulatory  General: Well-developed, well-nourished, no acute distress. Eyes: Pink conjunctiva, anicteric sclera. HEENT: Normocephalic, moist mucous membranes, clear oropharnyx. Lungs: Clear to auscultation bilaterally. Heart: Regular rate and rhythm. No rubs, murmurs, or gallops. Abdomen: Soft, nontender, nondistended. No organomegaly noted, normoactive bowel sounds. Musculoskeletal: No edema, cyanosis, or clubbing. Neuro: Alert, answering all questions appropriately. Cranial nerves grossly intact. Skin: No rashes or petechiae noted. Psych: Normal affect. Lymphatics: No cervical, calvicular, axillary or inguinal LAD.   LAB RESULTS:  Lab Results  Component Value Date   NA 140 11/20/2016   K 3.9 11/20/2016   CL 96 (L) 11/20/2016   CO2 34 (H) 11/20/2016   GLUCOSE 90 11/20/2016   BUN 28 (H) 11/20/2016   CREATININE 1.20 11/23/2016   CALCIUM 9.7 11/20/2016   GFRNONAA >60 11/20/2016   GFRAA >60 11/20/2016    Lab Results  Component Value Date   WBC 11.7 (H) 11/20/2016   NEUTROABS 7.5 (H) 06/26/2014   HGB 9.5 (L) 11/20/2016   HCT 29.6 (L) 11/20/2016   MCV 82.8 11/20/2016   PLT 403 11/20/2016      STUDIES: Nm Pet Image Initial (pi) Skull Base To Thigh  Result Date: 12/07/2016 CLINICAL DATA:  Evaluation of a right upper lobe mass. EXAM: NUCLEAR MEDICINE PET SKULL BASE TO THIGH TECHNIQUE: 12.32 mCi F-18 FDG was injected intravenously. Full-ring PET imaging was performed from the skull base to thigh after the radiotracer. CT data was obtained and used for attenuation correction and anatomic localization. FASTING BLOOD GLUCOSE:  Value: 92 mg/dl COMPARISON:  CT scan of the abdomen October 07, 2016. CT scan of the chest November 23, 2016 FINDINGS: NECK Muscular uptake is seen in the neck bilaterally, right greater than left. Metastatic nodes are seen in the base of the neck on the right. At least 3 discrete nodes are identified with the largest  seen on CT image series 3, image 65 measuring 15 by 12 mm with a maximum SUV of 17.8. CHEST The dominant mass in the right upper lobe with invasion into the chest wall and erosion of the adjacent second rib is again identified. The mass measures 6.5 by 5.4 cm on CT image 87 with a maximum SUV of 28.5 and central necrosis. Just inferior to the dominant mass is nodularity extending from the hilum to the periphery of the right lung as seen on CT images 95 through 102. This nodularity is all FDG avid. Small nodes in the anterior right lung on CT images110 demonstrate mild FDG uptake. A small nodule in the posterior right lung on image 113 demonstrates mild FDG uptake. The largest nodule in the left in the lingula on image 110 demonstrates abnormal FDG uptake with a maximum SUV of 4.7. This nodule measures 11 by 8.4 mm. Other opacities posteriorly within the lungs have an infectious appearance. There is a soft tissue nodule in the subpleural fat medially in the right apex on CT image 76 which is FDG avid. There is mild uptake in the region of the manubrium on image 90 which is suspicious. It is unclear whether this is bony or soft tissue in origin. There is abnormal  uptake in T1 which is consistent with metastatic disease. An MRI could better assess. Extension into the canal is not excluded on PET imaging. No other mediastinal or hilar disease identified. There is a subcutaneous nodule deep to the right latissimus dorsi on CT image 136 which is FDG avid. ABDOMEN/PELVIS A subcutaneous nodule on the right lateral abdominal wall on CT image 160 is mildly FDG avid. There is mild uptake in the left adrenal gland, asymmetric to the right, with no CT correlate. Uptake throughout the colon is likely physiologic. There is uptake in the distal left ureter which is likely normal. Uptake in the right paraspinous musculature on image 183 is nonspecific. Recommend attention on follow-up. There is uptake in the left gluteus muscle on image wall 271 with a maximum SUV of 5. SKELETON Abnormal uptake mid T1 vertebral body with no CT correlate. Bony destruction of the right second rib. IMPRESSION: 1. The mass in the right upper lobe is FDG avid, consistent with malignancy. The mass invades the chest wall and erodes the second rib. Bilateral pulmonary nodules are FDG avid concerning for metastatic disease. Metastatic nodes in the right side of the neck are noted. Metastatic disease to T1 is identified. An MRI could better assess extent of the T1 metastatic lesion. Canal extension not excluded on PET imaging. Subcutaneous and muscular soft tissue nodules in the chest, abdomen, and gluteus region are all consistent with metastatic disease. There is mild focal uptake in the left adrenal gland with no CT correlate. This is indeterminate but an early metastatic lesion is not excluded. Recommend attention in this region on follow-up. There is a metastatic node in the right medial subpleural space and metastatic disease either in or adjacent to the right side of the manubrium. Electronically Signed   By: Dorise Bullion III M.D   On: 12/07/2016 14:58   US Biopsy  Result Date: 12/14/2016 CLINICAL  DATA:  Large right upper lobe lung mass with abnormal right supraclavicular lymph nodes by PET scan. The patient presents for ultrasound-guided biopsy of one of the supraclavicular lymph nodes. EXAM: ULTRASOUND GUIDED CORE BIOPSY OF RIGHT SUPRACLAVICULAR LYMPH NODE COMPARISON:  PET scan on 12/07/2016 MEDICATIONS: 1.5 mg IV Versed; 50 mcg  IV Fentanyl Total Moderate Sedation Time: 15 minutes. The patient's level of consciousness and physiologic status were continuously monitored during the procedure by Radiology nursing. PROCEDURE: The procedure, risks, benefits, and alternatives were explained to the patient. Questions regarding the procedure were encouraged and answered. The patient understands and consents to the procedure. A time out was performed prior to initiating the procedure. The right neck was prepped with chlorhexidine in a sterile fashion, and a sterile drape was applied covering the operative field. A sterile gown and sterile gloves were used for the procedure. Local anesthesia was provided with 1% Lidocaine. Ultrasound was used to localize right-sided supraclavicular/lower cervical lymph node tissue. Under ultrasound guidance, a total of 518 gauge core biopsy samples were obtained and samples submitted in formalin. COMPLICATIONS: None. FINDINGS: Ill-defined soft tissue in the right supraclavicular region was identified which appears to correlate anatomically to the abnormal lymph node seen by PET scan. Solid tissue was obtained. IMPRESSION: Ultrasound-guided core biopsy performed of abnormal appearing soft tissue in the right supraclavicular neck which appears to correspond to the abnormal hypermetabolic lymph nodes seen by PET scan. Electronically Signed   By: Aletta Edouard M.D.   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  PLAN:    1. 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 Patient may require an MRI of the near future to further evaluate the T1 lesion. Although patient is stage IV, he will benefit from initial therapy using concurrent chemotherapy and XRT given his significant pain in his right flank and shoulder from chest wall invasion of his tumor. A referral has been made to radiation oncology.  Will get MRI of the brain to complete the staging workup.  Patient will return to clinic on December 28, 2016 to initiate cycle 1 of weekly carboplatinum and Taxol.  2. Flank pain: Likely secondary to lateral chest wall invasion of lung mass. Continue current narcotic regimen. Referral to radiation oncology as above. 3. Bladder cancer: Patient known to have high-grade noninvasive urothelial carcinoma. His treatment with carboplatinum may be of some benefit, but in the future can consider a chemotherapeutic regimen for his lung cancer that would also benefit bladder cancer such as cisplatin and gemcitabine.   Approximately 30 minutes was spent in discussion of which greater than 50% was consultation.  Patient expressed understanding and was in agreement with this plan. He also understands that He can call clinic at any time with any questions, concerns, or complaints.   Cancer Staging Primary lung squamous cell carcinoma, right [facility]) Staging form: Lung, AJCC 8th Edition - Clinical stage from 12/23/2016: Stage IVA (cT3, cN3, pM1a) - Signed by Lloyd Huger, MD on 12/23/2016   Lloyd Huger, MD   12/23/2016 3:27 PM

## 2016-12-21 ENCOUNTER — Inpatient Hospital Stay: Payer: Medicare Other | Attending: Oncology | Admitting: Oncology

## 2016-12-21 ENCOUNTER — Other Ambulatory Visit: Payer: Medicare Other

## 2016-12-21 VITALS — BP 125/66 | HR 86 | Temp 98.2°F | Resp 18 | Wt 163.1 lb

## 2016-12-21 DIAGNOSIS — C7951 Secondary malignant neoplasm of bone: Secondary | ICD-10-CM | POA: Diagnosis not present

## 2016-12-21 DIAGNOSIS — C3411 Malignant neoplasm of upper lobe, right bronchus or lung: Secondary | ICD-10-CM

## 2016-12-21 DIAGNOSIS — R5383 Other fatigue: Secondary | ICD-10-CM | POA: Insufficient documentation

## 2016-12-21 DIAGNOSIS — G4730 Sleep apnea, unspecified: Secondary | ICD-10-CM | POA: Diagnosis not present

## 2016-12-21 DIAGNOSIS — Z87442 Personal history of urinary calculi: Secondary | ICD-10-CM

## 2016-12-21 DIAGNOSIS — C3491 Malignant neoplasm of unspecified part of right bronchus or lung: Secondary | ICD-10-CM

## 2016-12-21 DIAGNOSIS — J449 Chronic obstructive pulmonary disease, unspecified: Secondary | ICD-10-CM

## 2016-12-21 DIAGNOSIS — R531 Weakness: Secondary | ICD-10-CM | POA: Diagnosis not present

## 2016-12-21 DIAGNOSIS — R918 Other nonspecific abnormal finding of lung field: Secondary | ICD-10-CM

## 2016-12-21 DIAGNOSIS — E119 Type 2 diabetes mellitus without complications: Secondary | ICD-10-CM

## 2016-12-21 DIAGNOSIS — I10 Essential (primary) hypertension: Secondary | ICD-10-CM | POA: Diagnosis not present

## 2016-12-21 DIAGNOSIS — C7802 Secondary malignant neoplasm of left lung: Secondary | ICD-10-CM | POA: Diagnosis not present

## 2016-12-21 DIAGNOSIS — C679 Malignant neoplasm of bladder, unspecified: Secondary | ICD-10-CM | POA: Diagnosis not present

## 2016-12-21 DIAGNOSIS — F419 Anxiety disorder, unspecified: Secondary | ICD-10-CM

## 2016-12-21 DIAGNOSIS — M25511 Pain in right shoulder: Secondary | ICD-10-CM

## 2016-12-21 DIAGNOSIS — E785 Hyperlipidemia, unspecified: Secondary | ICD-10-CM | POA: Diagnosis not present

## 2016-12-21 DIAGNOSIS — E1165 Type 2 diabetes mellitus with hyperglycemia: Secondary | ICD-10-CM | POA: Insufficient documentation

## 2016-12-21 DIAGNOSIS — Z7984 Long term (current) use of oral hypoglycemic drugs: Secondary | ICD-10-CM

## 2016-12-21 DIAGNOSIS — R109 Unspecified abdominal pain: Secondary | ICD-10-CM | POA: Diagnosis not present

## 2016-12-21 DIAGNOSIS — R0602 Shortness of breath: Secondary | ICD-10-CM | POA: Diagnosis not present

## 2016-12-21 DIAGNOSIS — Z87891 Personal history of nicotine dependence: Secondary | ICD-10-CM | POA: Diagnosis not present

## 2016-12-21 DIAGNOSIS — Z79899 Other long term (current) drug therapy: Secondary | ICD-10-CM

## 2016-12-21 DIAGNOSIS — Z85828 Personal history of other malignant neoplasm of skin: Secondary | ICD-10-CM | POA: Diagnosis not present

## 2016-12-21 DIAGNOSIS — Z7189 Other specified counseling: Secondary | ICD-10-CM

## 2016-12-21 DIAGNOSIS — Z5111 Encounter for antineoplastic chemotherapy: Secondary | ICD-10-CM | POA: Insufficient documentation

## 2016-12-21 DIAGNOSIS — Z7982 Long term (current) use of aspirin: Secondary | ICD-10-CM

## 2016-12-21 DIAGNOSIS — Z803 Family history of malignant neoplasm of breast: Secondary | ICD-10-CM

## 2016-12-21 DIAGNOSIS — C7801 Secondary malignant neoplasm of right lung: Secondary | ICD-10-CM

## 2016-12-21 NOTE — Progress Notes (Signed)
Patient states his throat is sore.  Also gums are sore under his dentures.  Here today for biopsy results.

## 2016-12-22 ENCOUNTER — Inpatient Hospital Stay: Payer: Medicare Other

## 2016-12-23 DIAGNOSIS — Z7189 Other specified counseling: Secondary | ICD-10-CM | POA: Insufficient documentation

## 2016-12-23 MED ORDER — LIDOCAINE-PRILOCAINE 2.5-2.5 % EX CREA: TOPICAL_CREAM | CUTANEOUS | 3 refills | Status: DC

## 2016-12-23 MED ORDER — PROCHLORPERAZINE MALEATE 10 MG PO TABS: 10.0000 mg | ORAL_TABLET | Freq: Four times a day (QID) | ORAL | 2 refills | Status: DC | PRN

## 2016-12-23 MED ORDER — ONDANSETRON HCL 8 MG PO TABS: 8.0000 mg | ORAL_TABLET | Freq: Two times a day (BID) | ORAL | 2 refills | Status: DC | PRN

## 2016-12-23 NOTE — Progress Notes (Signed)
START OFF PATHWAY REGIMEN - Non-Small Cell Lung   [id number]:Carboplatin + Paclitaxel (2/50) + RT weekly x 6 weeks:   Administer weekly during RT:     Paclitaxel      Carboplatin   **Always confirm dose/schedule in your pharmacy ordering system**    Patient Characteristics: Stage IV Metastatic, Squamous, PS = 0, 1, First Line, PD-L1 Expression Positive 1-49% (TPS) / Negative / Not Tested / Not a Candidate for Immunotherapy AJCC T Category: T3 Current Disease Status: Distant Metastases AJCC N Category: N3 AJCC M Category: M1a AJCC 8 Stage Grouping: IVA Histology: Squamous Cell Line of therapy: First Line PD-L1 Expression Status: Test Not Ordered Performance Status: PS = 0, 1 Would you be surprised if this patient died  in the next year? I would NOT be surprised if this patient died in the next year  Intent of Therapy: Non-Curative / Palliative Intent, Discussed with Patient

## 2016-12-24 ENCOUNTER — Encounter: Payer: Self-pay | Admitting: Radiation Oncology

## 2016-12-24 ENCOUNTER — Other Ambulatory Visit: Payer: Self-pay | Admitting: *Deleted

## 2016-12-24 ENCOUNTER — Other Ambulatory Visit (INDEPENDENT_AMBULATORY_CARE_PROVIDER_SITE_OTHER): Payer: Self-pay | Admitting: Vascular Surgery

## 2016-12-24 ENCOUNTER — Institutional Professional Consult (permissible substitution): Payer: Medicare Other | Admitting: Radiation Oncology

## 2016-12-24 ENCOUNTER — Other Ambulatory Visit: Payer: Medicare Other

## 2016-12-24 ENCOUNTER — Ambulatory Visit
Admission: RE | Admit: 2016-12-24 | Discharge: 2016-12-24 | Disposition: A | Discharge status: Home / Self Care | Payer: Medicare Other | Source: Ambulatory Visit | Attending: Radiation Oncology | Admitting: Radiation Oncology

## 2016-12-24 VITALS — BP 130/54 | HR 98 | Temp 98.4°F | Resp 20 | Wt 162.3 lb

## 2016-12-24 DIAGNOSIS — C7951 Secondary malignant neoplasm of bone: Secondary | ICD-10-CM | POA: Insufficient documentation

## 2016-12-24 DIAGNOSIS — Z8701 Personal history of pneumonia (recurrent): Secondary | ICD-10-CM | POA: Insufficient documentation

## 2016-12-24 DIAGNOSIS — Z803 Family history of malignant neoplasm of breast: Secondary | ICD-10-CM | POA: Diagnosis not present

## 2016-12-24 DIAGNOSIS — M129 Arthropathy, unspecified: Secondary | ICD-10-CM | POA: Insufficient documentation

## 2016-12-24 DIAGNOSIS — Z85828 Personal history of other malignant neoplasm of skin: Secondary | ICD-10-CM | POA: Diagnosis not present

## 2016-12-24 DIAGNOSIS — Z7982 Long term (current) use of aspirin: Secondary | ICD-10-CM | POA: Diagnosis not present

## 2016-12-24 DIAGNOSIS — E119 Type 2 diabetes mellitus without complications: Secondary | ICD-10-CM | POA: Diagnosis not present

## 2016-12-24 DIAGNOSIS — I10 Essential (primary) hypertension: Secondary | ICD-10-CM | POA: Insufficient documentation

## 2016-12-24 DIAGNOSIS — C3411 Malignant neoplasm of upper lobe, right bronchus or lung: Secondary | ICD-10-CM | POA: Diagnosis not present

## 2016-12-24 DIAGNOSIS — E785 Hyperlipidemia, unspecified: Secondary | ICD-10-CM | POA: Insufficient documentation

## 2016-12-24 DIAGNOSIS — Z87891 Personal history of nicotine dependence: Secondary | ICD-10-CM | POA: Diagnosis not present

## 2016-12-24 DIAGNOSIS — R0602 Shortness of breath: Secondary | ICD-10-CM | POA: Diagnosis not present

## 2016-12-24 DIAGNOSIS — Z510 Encounter for antineoplastic radiation therapy: Secondary | ICD-10-CM | POA: Diagnosis not present

## 2016-12-24 DIAGNOSIS — G4730 Sleep apnea, unspecified: Secondary | ICD-10-CM | POA: Insufficient documentation

## 2016-12-24 DIAGNOSIS — Z8551 Personal history of malignant neoplasm of bladder: Secondary | ICD-10-CM | POA: Insufficient documentation

## 2016-12-24 DIAGNOSIS — J449 Chronic obstructive pulmonary disease, unspecified: Secondary | ICD-10-CM | POA: Insufficient documentation

## 2016-12-24 DIAGNOSIS — Z7984 Long term (current) use of oral hypoglycemic drugs: Secondary | ICD-10-CM | POA: Diagnosis not present

## 2016-12-24 DIAGNOSIS — Z87442 Personal history of urinary calculi: Secondary | ICD-10-CM | POA: Diagnosis not present

## 2016-12-24 DIAGNOSIS — M25511 Pain in right shoulder: Secondary | ICD-10-CM | POA: Insufficient documentation

## 2016-12-24 MED ORDER — SUCRALFATE 1 G PO TABS: 1.0000 g | ORAL_TABLET | Freq: Three times a day (TID) | ORAL | 3 refills | Status: DC

## 2016-12-24 MED ORDER — AZITHROMYCIN 250 MG PO TABS: ORAL_TABLET | ORAL | 0 refills | Status: DC

## 2016-12-24 NOTE — Consult Note (Signed)
NEW PATIENT EVALUATION  Name: [name] [name]  MRN: [id number]  Date:   12/24/2016     DOB: [birth year]   This 76 y.o. male patient presents to the clinic for initial evaluation of stage IV squamous cell carcinoma the right upper lobe with invasion of the chest wall causing significant pain.  REFERRING PHYSICIAN: Albina Billet, MD  CHIEF COMPLAINT:  Chief Complaint  Patient presents with  . Lung Cancer    Pt is here for initial consultation of lung cancer.      DIAGNOSIS: The encounter diagnosis was Cancer of upper lobe of right lung ([place]).   PREVIOUS INVESTIGATIONS:  PET CT and CT scans reviewed Pathology reports reviewed Clinical notes reviewed  HPI: Patient is a 76 year old male who presented with several month history of increasing right shoulder pain. Plain chest film showed a pleural-based right apical mass with possible chest wall involvement. CT scan was performed showing right upper lobe mass with central necrosis and cavitation chest wall invasion and involvement and destruction of the second rib. There is associated right hilar and mediastinal adenopathy. He went on to have a PET CT scan showing hypermetabolic activity in the right upper lobe lesion with lesion invading the chest wall there also bilateral pulmonary nodules FDG avid concerning for metastatic disease. He also neck metastasis and had sub-cutaneous a he had a CT-guided biopsy of right supraclavicular lymph node positive for metastatic squamous cell carcinoma nd muscular soft tissue nodules in the chest abdomen and gluteal region consistent with metastatic disease. He has been seen by medical oncology and is having a port placed on Monday of next week is seen today for radiation oncology opinion. Case was discussed at tumor conference and recommendation for palliative radiation therapy was made. He is on 24-hour nasal oxygen. He's having no cough hemoptysis or chest tightness at this time.  PLANNED TREATMENT REGIMEN:  Palliative radiation therapy to chest  PAST MEDICAL HISTORY:  has a past medical history of Arthritis; Basal cell carcinoma of skin; Bladder cancer ([facility]); COPD (chronic obstructive pulmonary disease) ([place]); Diabetes mellitus without complication ([place]); Dyspnea; History of kidney stones; Hyperlipidemia; Hypertension; Kidney stone; Pneumonia; Sleep apnea; and Squamous cell skin cancer.    PAST SURGICAL HISTORY:  Past Surgical History:  Procedure Laterality Date  . BLADDER FULGURATION    . HERNIA REPAIR      FAMILY HISTORY: family history includes Breast cancer in his mother; Diabetes in his father; Stroke in his father.  SOCIAL HISTORY:  reports that he quit smoking about 3 years ago. He quit after 60.00 years of use. His smokeless tobacco use includes Snuff. He reports that he does not drink alcohol or use drugs.  ALLERGIES: Patient has no known allergies.  MEDICATIONS:  Current Outpatient Prescriptions  Medication Sig Dispense Refill  . acetaminophen (TYLENOL) 500 MG tablet Take 500 mg by mouth every 6 (six) hours as needed for moderate pain or headache.    . albuterol (PROVENTIL HFA;VENTOLIN HFA) 108 (90 Base) MCG/ACT inhaler Inhale 1-2 puffs into the lungs every 6 (six) hours as needed for wheezing or shortness of breath.     Marland Kitchen albuterol (PROVENTIL) (2.5 MG/3ML) 0.083% nebulizer solution Take 2.5 mg by nebulization every 4 (four) hours as needed for wheezing or shortness of breath.     Marland Kitchen aspirin 81 MG chewable tablet TAKE 1 TABLET (81 MG) BY MOUTH AT NIGHT    . Cholecalciferol (VITAMIN D) 2000 units CAPS Take 2,000 Units by mouth daily.    Marland Kitchen  colchicine 0.6 MG tablet Take 0.6 mg by mouth daily.    . diphenhydramine-acetaminophen (TYLENOL PM) 25-500 MG TABS tablet Take 1-2 tablets by mouth at bedtime as needed (sleep).    Marland Kitchen glimepiride (AMARYL) 2 MG tablet Take 2 mg by mouth daily.     Marland Kitchen lidocaine-prilocaine (EMLA) cream Apply to affected area once 30 g 3  . lisinopril (PRINIVIL,ZESTRIL)  40 MG tablet Take 40 mg by mouth daily.    . metFORMIN (GLUCOPHAGE-XR) 500 MG 24 hr tablet Take 1,000 mg by mouth 2 (two) times daily.     . Omega-3 Fatty Acids (FISH OIL) 1200 MG CAPS Take 1,200 mg by mouth daily.    . ondansetron (ZOFRAN) 8 MG tablet Take 1 tablet (8 mg total) by mouth 2 (two) times daily as needed for refractory nausea / vomiting. 60 tablet 2  . oxyCODONE-acetaminophen (PERCOCET/ROXICET) 5-325 MG tablet Take 1 tablet by mouth every 4 (four) hours as needed for severe pain. 90 tablet 0  . OXYGEN Inhale 2 L into the lungs continuous.    . prochlorperazine (COMPAZINE) 10 MG tablet Take 1 tablet (10 mg total) by mouth every 6 (six) hours as needed (Nausea or vomiting). 60 tablet 2  . simvastatin (ZOCOR) 40 MG tablet Take 40 mg by mouth daily.     . SYMBICORT 160-4.5 MCG/ACT inhaler Inhale 2 puffs into the lungs 2 (two) times daily.    . tamsulosin (FLOMAX) 0.4 MG CAPS capsule TAKE 1 TABLET BY MOUTH EVERY NIGHT BEFORE BEDTIME    . tiotropium (SPIRIVA HANDIHALER) 18 MCG inhalation capsule Place 18 mcg into inhaler and inhale at bedtime.     . traMADol (ULTRAM) 50 MG tablet Take 50-100 mg by mouth every 6 (six) hours as needed (for pain.).     Marland Kitchen azithromycin (ZITHROMAX Z-PAK) 250 MG tablet Follow package directions 6 each 0  . sucralfate (CARAFATE) 1 g tablet Take 1 tablet (1 g total) by mouth 3 (three) times daily. Dissolve tablet in water, swish and swallow 90 tablet 3   No current facility-administered medications for this encounter.     ECOG PERFORMANCE STATUS:  1 - Symptomatic but completely ambulatory  REVIEW OF SYSTEMS: Except for the problems with shortness of breath and right shoulder pain  Patient denies any weight loss, fatigue, weakness, fever, chills or night sweats. Patient denies any loss of vision, blurred vision. Patient denies any ringing  of the ears or hearing loss. No irregular heartbeat. Patient denies heart murmur or history of fainting. Patient denies any  chest pain or pain radiating to her upper extremities. Patient denies any shortness of breath, difficulty breathing at night, cough or hemoptysis. Patient denies any swelling in the lower legs. Patient denies any nausea vomiting, vomiting of blood, or coffee ground material in the vomitus. Patient denies any stomach pain. Patient states has had normal bowel movements no significant constipation or diarrhea. Patient denies any dysuria, hematuria or significant nocturia. Patient denies any problems walking, swelling in the joints or loss of balance. Patient denies any skin changes, loss of hair or loss of weight. Patient denies any excessive worrying or anxiety or significant depression. Patient denies any problems with insomnia. Patient denies excessive thirst, polyuria, polydipsia. Patient denies any swollen glands, patient denies easy bruising or easy bleeding. Patient denies any recent infections, allergies or URI. Patient "s visual fields have not changed significantly in recent time.   PHYSICAL EXAM: BP (!) 130/54   Pulse 98   Temp 98.4 F (36.9  C)   Resp 20   Wt 162 lb 4.1 oz (73.6 kg)   BMI 27.00 kg/m  Well-developed frail male on nasal oxygen in moderate pain. Range of motion of his right upper extremities doesn't elicit some pain. No supraclavicular or axillary adenopathy is identified. Well-developed well-nourished patient in NAD. HEENT reveals PERLA, EOMI, discs not visualized.  Oral cavity is clear. No oral mucosal lesions are identified. Neck is clear without evidence of cervical or supraclavicular adenopathy. Lungs are clear to A&P. Cardiac examination is essentially unremarkable with regular rate and rhythm without murmur rub or thrill. Abdomen is benign with no organomegaly or masses noted. Motor sensory and DTR levels are equal and symmetric in the upper and lower extremities. Cranial nerves II through XII are grossly intact. Proprioception is intact. No peripheral adenopathy or edema is  identified. No motor or sensory levels are noted. Crude visual fields are within normal range.  LABORATORY DATA: Pathology reports reviewed    RADIOLOGY RESULTS: CT scan plain films and PET/CT scan all reviewed   IMPRESSION: Stage IV squamous cell carcinoma of the right upper lobe in 76 year old male with lesion invading the right chest wall with destruction of the second rib.  PLAN: At this time I to go ahead with palliative course of radiation therapy to his right upper lobe mass incorporating the areas of right hilar adenopathy T2 and supraclavicular nodes. I will plan delivering 4000 cGy in 20 fractions using three-dimensional treatment planning. Patient also will be starting chemotherapy.There will be extra effort by both professional staff as well as technical staff to coordinate and manage concurrent chemoradiation and ensuing side effects during his treatments. Risks and benefits of treatment including possible increased cough radiation esophagitis alteration of blood counts skin reaction fatigue all were discussed in detail with the patient and his wife. I personally set up and ordered CT simulation for early next week. I've asked the surgeon to place his Port-A-Cath in his left anterior chest wall to keep my right chest wall clear for my treatment fields. We'll coordinate chemotherapy with medical oncology.  I would like to take this opportunity to thank you for allowing me to participate in the care of your patient.Armstead Peaks., MD

## 2016-12-24 NOTE — Patient Instructions (Signed)

## 2016-12-25 ENCOUNTER — Telehealth: Payer: Self-pay

## 2016-12-25 ENCOUNTER — Other Ambulatory Visit: Payer: Self-pay | Admitting: *Deleted

## 2016-12-25 NOTE — Telephone Encounter (Signed)
-----   Message from Daiva Huge, RN sent at 12/25/2016  1:13 PM EST ----- Regarding: Nausea meds question Do you mind calling [name] wife, she had a couple questions about the new meds Woodfin Ganja called in for them.    Thanks,   [facility]

## 2016-12-25 NOTE — Telephone Encounter (Signed)
Called patient number listed in chart left message on voice mail to return call.

## 2016-12-27 MED ORDER — CEFAZOLIN IN D5W 1 GM/50ML IV SOLN
1.0000 g | Freq: Once | INTRAVENOUS | Status: AC
  Administered 2016-12-28: 1 g via INTRAVENOUS

## 2016-12-28 ENCOUNTER — Encounter
Admission: RE | Disposition: A | Discharge status: Home / Self Care | Payer: Self-pay | Source: Ambulatory Visit | Attending: Vascular Surgery

## 2016-12-28 ENCOUNTER — Ambulatory Visit
Admission: RE | Admit: 2016-12-28 | Discharge: 2016-12-28 | Disposition: A | Discharge status: Home / Self Care | Payer: Medicare Other | Source: Ambulatory Visit | Attending: Vascular Surgery | Admitting: Vascular Surgery

## 2016-12-28 DIAGNOSIS — M1990 Unspecified osteoarthritis, unspecified site: Secondary | ICD-10-CM | POA: Insufficient documentation

## 2016-12-28 DIAGNOSIS — Z87442 Personal history of urinary calculi: Secondary | ICD-10-CM | POA: Diagnosis not present

## 2016-12-28 DIAGNOSIS — Z7984 Long term (current) use of oral hypoglycemic drugs: Secondary | ICD-10-CM | POA: Insufficient documentation

## 2016-12-28 DIAGNOSIS — Z7982 Long term (current) use of aspirin: Secondary | ICD-10-CM | POA: Diagnosis not present

## 2016-12-28 DIAGNOSIS — Z9981 Dependence on supplemental oxygen: Secondary | ICD-10-CM | POA: Insufficient documentation

## 2016-12-28 DIAGNOSIS — Z823 Family history of stroke: Secondary | ICD-10-CM | POA: Diagnosis not present

## 2016-12-28 DIAGNOSIS — Z833 Family history of diabetes mellitus: Secondary | ICD-10-CM | POA: Diagnosis not present

## 2016-12-28 DIAGNOSIS — Z85828 Personal history of other malignant neoplasm of skin: Secondary | ICD-10-CM | POA: Diagnosis not present

## 2016-12-28 DIAGNOSIS — E119 Type 2 diabetes mellitus without complications: Secondary | ICD-10-CM | POA: Insufficient documentation

## 2016-12-28 DIAGNOSIS — E785 Hyperlipidemia, unspecified: Secondary | ICD-10-CM | POA: Diagnosis not present

## 2016-12-28 DIAGNOSIS — G4730 Sleep apnea, unspecified: Secondary | ICD-10-CM | POA: Insufficient documentation

## 2016-12-28 DIAGNOSIS — C3490 Malignant neoplasm of unspecified part of unspecified bronchus or lung: Secondary | ICD-10-CM | POA: Diagnosis not present

## 2016-12-28 DIAGNOSIS — I10 Essential (primary) hypertension: Secondary | ICD-10-CM | POA: Insufficient documentation

## 2016-12-28 DIAGNOSIS — J449 Chronic obstructive pulmonary disease, unspecified: Secondary | ICD-10-CM | POA: Insufficient documentation

## 2016-12-28 DIAGNOSIS — Z803 Family history of malignant neoplasm of breast: Secondary | ICD-10-CM | POA: Insufficient documentation

## 2016-12-28 DIAGNOSIS — C679 Malignant neoplasm of bladder, unspecified: Secondary | ICD-10-CM | POA: Insufficient documentation

## 2016-12-28 DIAGNOSIS — Z87891 Personal history of nicotine dependence: Secondary | ICD-10-CM | POA: Insufficient documentation

## 2016-12-28 HISTORY — PX: PORTA CATH INSERTION: CATH118285

## 2016-12-28 SURGERY — PORTA CATH INSERTION
Anesthesia: Moderate Sedation

## 2016-12-28 MED ORDER — HYDROMORPHONE HCL 1 MG/ML IJ SOLN: 1.0000 mg | Freq: Once | INTRAMUSCULAR | Status: DC

## 2016-12-28 MED ORDER — GENTAMICIN SULFATE 40 MG/ML IJ SOLN
Freq: Once | INTRAMUSCULAR | Status: DC
  Filled 2016-12-28 (×2): qty 2

## 2016-12-28 MED ORDER — METHYLPREDNISOLONE SODIUM SUCC 125 MG IJ SOLR: 125.0000 mg | INTRAMUSCULAR | Status: DC | PRN

## 2016-12-28 MED ORDER — SODIUM CHLORIDE 0.9 % IV SOLN
INTRAVENOUS | Status: DC
  Administered 2016-12-28: 10:00:00 via INTRAVENOUS

## 2016-12-28 MED ORDER — MIDAZOLAM HCL 5 MG/5ML IJ SOLN
INTRAMUSCULAR | Status: AC
  Filled 2016-12-28: qty 5

## 2016-12-28 MED ORDER — MIDAZOLAM HCL 2 MG/2ML IJ SOLN
INTRAMUSCULAR | Status: DC | PRN
  Administered 2016-12-28: 2 mg via INTRAVENOUS

## 2016-12-28 MED ORDER — LIDOCAINE-EPINEPHRINE (PF) 2 %-1:200000 IJ SOLN
INTRAMUSCULAR | Status: AC
  Filled 2016-12-28: qty 20

## 2016-12-28 MED ORDER — FAMOTIDINE 20 MG PO TABS: 40.0000 mg | ORAL_TABLET | ORAL | Status: DC | PRN

## 2016-12-28 MED ORDER — HYDROMORPHONE HCL 1 MG/ML IJ SOLN
0.5000 mg | Freq: Once | INTRAMUSCULAR | Status: AC | PRN
  Administered 2016-12-28: 0.5 mg via INTRAVENOUS

## 2016-12-28 MED ORDER — FENTANYL CITRATE (PF) 100 MCG/2ML IJ SOLN
INTRAMUSCULAR | Status: AC
Start: 2016-12-28 — End: ?
  Filled 2016-12-28: qty 2

## 2016-12-28 MED ORDER — HEPARIN (PORCINE) IN NACL 2-0.9 UNIT/ML-% IJ SOLN
INTRAMUSCULAR | Status: AC
  Filled 2016-12-28: qty 500

## 2016-12-28 MED ORDER — ONDANSETRON HCL 4 MG/2ML IJ SOLN: 4.0000 mg | Freq: Four times a day (QID) | INTRAMUSCULAR | Status: DC | PRN

## 2016-12-28 MED ORDER — FENTANYL CITRATE (PF) 100 MCG/2ML IJ SOLN
INTRAMUSCULAR | Status: DC | PRN
  Administered 2016-12-28: 50 ug via INTRAVENOUS

## 2016-12-28 MED ORDER — HYDROMORPHONE HCL 1 MG/ML IJ SOLN
INTRAMUSCULAR | Status: AC
  Filled 2016-12-28: qty 0.5

## 2016-12-28 SURGICAL SUPPLY — 9 items
DERMABOND ADVANCED (GAUZE/BANDAGES/DRESSINGS) ×2
DERMABOND ADVANCED .7 DNX12 (GAUZE/BANDAGES/DRESSINGS) ×1 IMPLANT
KIT PORT POWER 8FR ISP CVUE (Catheter) ×3 IMPLANT
PACK ANGIOGRAPHY (CUSTOM PROCEDURE TRAY) ×3 IMPLANT
PAD GROUND ADULT SPLIT (MISCELLANEOUS) ×3 IMPLANT
PENCIL ELECTRO HAND CTR (MISCELLANEOUS) ×6 IMPLANT
SUT MNCRL AB 4-0 PS2 18 (SUTURE) ×3 IMPLANT
SUT PROLENE 0 CT 1 30 (SUTURE) ×3 IMPLANT
SUTURE VIC 3-0 (SUTURE) ×3 IMPLANT

## 2016-12-28 NOTE — Discharge Instructions (Signed)
Implanted Port Insertion, Care After °This sheet gives you information about how to care for yourself after your procedure. Your health care provider may also give you more specific instructions. If you have problems or questions, contact your health care provider. °What can I expect after the procedure? °After your procedure, it is common to have: °· Discomfort at the port insertion site. °· Bruising on the skin over the port. This should improve over 3-4 days. ° °Follow these instructions at home: °Port care °· After your port is placed, you will get a manufacturer's information card. The card has information about your port. Keep this card with you at all times. °· Take care of the port as told by your health care provider. Ask your health care provider if you or a family member can get training for taking care of the port at home. A home health care nurse may also take care of the port. °· Make sure to remember what type of port you have. °Incision care °· Follow instructions from your health care provider about how to take care of your port insertion site. Make sure you: °? Wash your hands with soap and water before you change your bandage (dressing). If soap and water are not available, use hand sanitizer. °? Change your dressing as told by your health care provider. °? Leave stitches (sutures), skin glue, or adhesive strips in place. These skin closures may need to stay in place for 2 weeks or longer. If adhesive strip edges start to loosen and curl up, you may trim the loose edges. Do not remove adhesive strips completely unless your health care provider tells you to do that. °· Check your port insertion site every day for signs of infection. Check for: °? More redness, swelling, or pain. °? More fluid or blood. °? Warmth. °? Pus or a bad smell. °General instructions °· Do not take baths, swim, or use a hot tub until your health care provider approves. °· Do not lift anything that is heavier than 10 lb (4.5  kg) for a week, or as told by your health care provider. °· Ask your health care provider when it is okay to: °? Return to work or school. °? Resume usual physical activities or sports. °· Do not drive for 24 hours if you were given a medicine to help you relax (sedative). °· Take over-the-counter and prescription medicines only as told by your health care provider. °· Wear a medical alert bracelet in case of an emergency. This will tell any health care providers that you have a port. °· Keep all follow-up visits as told by your health care provider. This is important. °Contact a health care provider if: °· You cannot flush your port with saline as directed, or you cannot draw blood from the port. °· You have a fever or chills. °· You have more redness, swelling, or pain around your port insertion site. °· You have more fluid or blood coming from your port insertion site. °· Your port insertion site feels warm to the touch. °· You have pus or a bad smell coming from the port insertion site. °Get help right away if: °· You have chest pain or shortness of breath. °· You have bleeding from your port that you cannot control. °Summary °· Take care of the port as told by your health care provider. °· Change your dressing as told by your health care provider. °· Keep all follow-up visits as told by your health care provider. °  This information is not intended to replace advice given to you by your health care provider. Make sure you discuss any questions you have with your health care provider. °Document Released: 07/26/2013 Document Revised: 08/26/2016 Document Reviewed: 08/26/2016 °Elsevier Interactive Patient Education © 2017 [employer] ° °

## 2016-12-28 NOTE — H&P (Signed)
[facility] History & Physical Update  The patient was interviewed and re-examined.  The patient's previous History and Physical has been reviewed and is unchanged.  There is no change in the plan of care. We plan to proceed with the scheduled procedure.  Leotis Pain, MD  12/28/2016, 1:25 PM

## 2016-12-28 NOTE — Op Note (Signed)
      [facility]       Operative Note  Date: 12/28/2016  Preoperative diagnosis:  1. Lung cancer  Postoperative diagnosis:  Same as above  Procedures: #1. Ultrasound guidance for vascular access to the right internal jugular vein. #2. Fluoroscopic guidance for placement of catheter. #3. Placement of CT compatible Port-A-Cath, right internal jugular vein.  Surgeon: Leotis Pain, MD.   Anesthesia: Local with moderate conscious sedation for approximately 25  minutes using 2 mg of Versed and 50 mcg of Fentanyl  Fluoroscopy time: less than 1 minute  Contrast used: 0  Estimated blood loss: minmal  Indication for the procedure:  The patient is a 76 y.o.male with lung cancer.  The patient needs a Port-A-Cath for durable venous access, chemotherapy, lab draws, and CT scans. We are asked to place this. Risks and benefits were discussed and informed consent was obtained.  Description of procedure: The patient was brought to the vascular and interventional radiology suite.  Moderate conscious sedation was administered throughout the procedure during a face to face encounter with the patient with my supervision of the RN administering medicines and monitoring the patient's vital signs, pulse oximetry, telemetry and mental status throughout from the start of the procedure until the patient was taken to the recovery room. The right neck chest and shoulder were sterilely prepped and draped, and a sterile surgical field was created. Ultrasound was used to help visualize a patent right internal jugular vein. This was then accessed under direct ultrasound guidance without difficulty with the Seldinger needle and a permanent image was recorded. A J-wire was placed. After skin nick and dilatation, the peel-away sheath was then placed over the wire. I then anesthetized an area under the clavicle approximately 1-2 fingerbreadths. A transverse incision was created and an inferior pocket was  created with electrocautery and blunt dissection. The port was then brought onto the field, placed into the pocket and secured to the chest wall with 2 Prolene sutures. The catheter was connected to the port and tunneled from the subclavicular incision to the access site. Fluoroscopic guidance was then used to cut the catheter to an appropriate length. The catheter was then placed through the peel-away sheath and the peel-away sheath was removed. The catheter tip was parked in excellent location under fluorocoscopic guidance in the cavoatrial junction. The pocket was then irrigated with antibiotic impregnated saline and the wound was closed with a running 3-0 Vicryl and a 4-0 Monocryl. The access incision was closed with a single 4-0 Monocryl. The Huber needle was used to withdraw blood and flush the port with heparinized saline. Dermabond was then placed as a dressing. The patient tolerated the procedure well and was taken to the recovery room in stable condition.   Leotis Pain 12/28/2016 1:26 PM   This note was created with Dragon Medical transcription system. Any errors in dictation are purely unintentional.

## 2016-12-29 ENCOUNTER — Inpatient Hospital Stay: Payer: Medicare Other

## 2016-12-29 ENCOUNTER — Encounter: Payer: Self-pay | Admitting: Vascular Surgery

## 2016-12-29 ENCOUNTER — Ambulatory Visit: Payer: Medicare Other | Admitting: Cardiology

## 2016-12-29 ENCOUNTER — Ambulatory Visit
Admission: RE | Admit: 2016-12-29 | Discharge: 2016-12-29 | Disposition: A | Discharge status: Home / Self Care | Payer: Medicare Other | Source: Ambulatory Visit | Attending: Radiation Oncology | Admitting: Radiation Oncology

## 2016-12-29 DIAGNOSIS — C3411 Malignant neoplasm of upper lobe, right bronchus or lung: Secondary | ICD-10-CM | POA: Diagnosis not present

## 2016-12-29 NOTE — Telephone Encounter (Signed)
Tried calling patient not able to leave voicemail

## 2016-12-30 NOTE — Telephone Encounter (Signed)
-----   Message from [facility], [place] sent at 12/30/2016  9:36 AM EDT ----- Regarding: RE: No Show for Chemo Class The patient does not have to attend the class if he is unable to but someone from his family like his wife can attend the class for him.   Just FYI.  ----- Message ----- From: Wallene Dales Sent: 12/30/2016   9:25 AM To: Lloyd Huger, MD, [facility], RN, # Subject: RE: No Show for Chemo Class                    I finally spoke with the pt's wife and she stated the pt cannot sit for a 3 hr class. I informed her that I would have a nurse call her about the class. Forwarded [place] the information to contact the wife.  Thank you  ----- Message ----- From: Lloyd Huger, MD Sent: 12/29/2016   3:22 PM To: Peter Garter, RN, # Subject: RE: No Show for Chemo Class                    Try to reschedule chemotherapy class for Thursday. I will see him Thursday after class and if we have time we will do chemotherapy then if not, he can start next week.   ----- Message ----- From: Wallene Dales Sent: 12/29/2016   2:11 PM To: Lloyd Huger, MD, [facility], RN, # Subject: No Show for Chemo Class                        Pt did not attend his Chemo Class today and he is scheduled to start treatment on Thursday. The pt however did come for his Radiation treatment at 11:00. I have attempted to contact the pt to reschedule, but his phone is off and his voicemail is not set up. Please advise.  Thank you

## 2016-12-30 NOTE — Telephone Encounter (Signed)
Spoke with wife this morning, informed her that someone needs to attend this class for patient in order to proceed with treatment. She advised that patient has an MRI in the morning and she might be a little late to the class. I assured her I would let Rosa know. Patient will be seeing [facility] tomorrow and possible start treatment next week. She was unaware that they had to do the Chemo class before treatment. She will sit in for patient tomorrow at Chemo class while he is seen in Med Onc by [facility].

## 2016-12-30 NOTE — Progress Notes (Signed)
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  INTERVAL HISTORY: Patient returns to clinic today for further evaluation and consideration of initiating carboplatinum and Taxol. His performance status has significantly declined this is secondary to right flank and shoulder pain. He has no neurologic complaints. He denies any recent fevers or illnesses. He has a good appetite and denies weight loss. He denies any shortness of breath, cough, or hemoptysis. He denies any nausea, vomiting, constipation, or diarrhea. He has no melena or hematochezia. He has no urinary complaints. Patient otherwise feels well and offers no further specific complaints.  REVIEW OF SYSTEMS:   Review of Systems  Constitutional: Negative.  Negative for fever, malaise/fatigue and weight loss.  Respiratory: Negative.  Negative for cough and shortness of breath.   Cardiovascular: Positive for chest pain. Negative for leg swelling.  Gastrointestinal: Negative.  Negative for abdominal pain.  Genitourinary: Positive for flank pain.  Neurological: Negative.  Negative for sensory change and weakness.  Psychiatric/Behavioral: The patient is nervous/anxious.     As per HPI. Otherwise, a complete review of systems is negative.  PAST MEDICAL HISTORY: Past Medical History:  Diagnosis Date  . Arthritis   . Basal cell carcinoma of skin   . Bladder cancer ([place])   . COPD (chronic obstructive pulmonary disease) ([place])   . Diabetes mellitus without complication ([place])   . Dyspnea    with exertion  . History of kidney stones   . Hyperlipidemia   . Hypertension   . Kidney stone   . Pneumonia   . Sleep apnea   . Squamous cell skin cancer     PAST SURGICAL  HISTORY: Past Surgical History:  Procedure Laterality Date  . BLADDER FULGURATION    . HERNIA REPAIR    . PORTA CATH INSERTION N/A 12/28/2016   Procedure: Glori Luis Cath Insertion;  Surgeon: Algernon Huxley, MD;  Location: [facility];  Service: Cardiovascular;  Laterality: N/A;    FAMILY HISTORY: Family History  Problem Relation Age of Onset  . Breast cancer Mother   . Stroke Father   . Diabetes Father   . Prostate cancer Neg Hx   . Bladder Cancer Neg Hx   . Kidney cancer Neg Hx     ADVANCED DIRECTIVES (Y/N):  N  HEALTH MAINTENANCE: Social History  Substance Use Topics  . Smoking status: Former Smoker    Years: 60.00    Quit date: 12/14/2013  . Smokeless tobacco: Current User    Types: Snuff  . Alcohol use No     Colonoscopy:  PAP:  Bone density:  Lipid panel:  No Known Allergies  Current Outpatient Prescriptions  Medication Sig Dispense Refill  . acetaminophen (TYLENOL) 500 MG tablet Take 500 mg by mouth every 6 (six) hours as needed for moderate pain or headache.    . albuterol (PROVENTIL HFA;VENTOLIN HFA) 108 (90 Base) MCG/ACT inhaler Inhale 1-2 puffs into the lungs every 6 (six) hours as needed for wheezing or shortness of breath.     Marland Kitchen albuterol (PROVENTIL) (2.5 MG/3ML) 0.083% nebulizer solution Take 2.5 mg by nebulization every 4 (four) hours as needed for wheezing or shortness of breath.     Marland Kitchen aspirin 81 MG chewable tablet TAKE 1 TABLET (81 MG) BY  MOUTH AT NIGHT    . Cholecalciferol (VITAMIN D) 2000 units CAPS Take 2,000 Units by mouth daily.    . colchicine 0.6 MG tablet Take 0.6 mg by mouth daily.    . diphenhydramine-acetaminophen (TYLENOL PM) 25-500 MG TABS tablet Take 1-2 tablets by mouth at bedtime as needed (sleep).    Marland Kitchen glimepiride (AMARYL) 2 MG tablet Take 2 mg by mouth daily.     Marland Kitchen lidocaine-prilocaine (EMLA) cream Apply to affected area once 30 g 3  . lisinopril (PRINIVIL,ZESTRIL) 40 MG tablet Take 40 mg by mouth daily.    . metFORMIN  (GLUCOPHAGE-XR) 500 MG 24 hr tablet Take 1,000 mg by mouth 2 (two) times daily.     . Omega-3 Fatty Acids (FISH OIL) 1200 MG CAPS Take 1,200 mg by mouth daily.    . ondansetron (ZOFRAN) 8 MG tablet Take 1 tablet (8 mg total) by mouth 2 (two) times daily as needed for refractory nausea / vomiting. 60 tablet 2  . OXYGEN Inhale 2 L into the lungs continuous.    . prochlorperazine (COMPAZINE) 10 MG tablet Take 1 tablet (10 mg total) by mouth every 6 (six) hours as needed (Nausea or vomiting). 60 tablet 2  . simvastatin (ZOCOR) 40 MG tablet Take 40 mg by mouth daily.     . sucralfate (CARAFATE) 1 g tablet Take 1 tablet (1 g total) by mouth 3 (three) times daily. Dissolve tablet in water, swish and swallow 90 tablet 3  . SYMBICORT 160-4.5 MCG/ACT inhaler Inhale 2 puffs into the lungs 2 (two) times daily.    . tamsulosin (FLOMAX) 0.4 MG CAPS capsule TAKE 1 TABLET BY MOUTH EVERY NIGHT BEFORE BEDTIME    . tiotropium (SPIRIVA HANDIHALER) 18 MCG inhalation capsule Place 18 mcg into inhaler and inhale at bedtime.     . traMADol (ULTRAM) 50 MG tablet Take 50-100 mg by mouth every 6 (six) hours as needed (for pain.).     Marland Kitchen fentaNYL (DURAGESIC - DOSED MCG/HR) 25 MCG/HR patch Place 1 patch (25 mcg total) onto the skin every 3 (three) days. 10 patch 0  . oxyCODONE-acetaminophen (PERCOCET/ROXICET) 5-325 MG tablet Take 1 tablet by mouth every 4 (four) hours as needed for severe pain. 90 tablet 0   No current facility-administered medications for this visit.     OBJECTIVE: Vitals:   12/31/16 1036  BP: 118/62  Pulse: 87  Resp: 18  Temp: (!) 96.6 F (35.9 C)     Body mass index is 26.6 kg/m.    ECOG FS:2 - Symptomatic, <50% confined to bed  General: Well-developed, well-nourished, no acute distress. Eyes: Pink conjunctiva, anicteric sclera. HEENT: Normocephalic, moist mucous membranes, clear oropharnyx. Lungs: Clear to auscultation bilaterally. Heart: Regular rate and rhythm. No rubs, murmurs, or  gallops. Abdomen: Soft, nontender, nondistended. No organomegaly noted, normoactive bowel sounds. Musculoskeletal: No edema, cyanosis, or clubbing. Neuro: Alert, answering all questions appropriately. Cranial nerves grossly intact. Skin: No rashes or petechiae noted. Psych: Normal affect. Lymphatics: No cervical, calvicular, axillary or inguinal LAD.   LAB RESULTS:  Lab Results  Component Value Date   NA 132 (L) 12/31/2016   K 4.3 12/31/2016   CL 94 (L) 12/31/2016   CO2 28 12/31/2016   GLUCOSE 139 (H) 12/31/2016   BUN 22 (H) 12/31/2016   CREATININE 1.02 12/31/2016   CALCIUM 10.2 12/31/2016   PROT 7.8 12/31/2016   ALBUMIN 2.9 (L) 12/31/2016   AST 18 12/31/2016   ALT 11 (L) 12/31/2016   ALKPHOS 71 12/31/2016  BILITOT 0.2 (L) 12/31/2016   GFRNONAA >60 12/31/2016   GFRAA >60 12/31/2016    Lab Results  Component Value Date   WBC 16.3 (H) 12/31/2016   NEUTROABS 12.8 (H) 12/31/2016   HGB 8.0 (L) 12/31/2016   HCT 25.4 (L) 12/31/2016   MCV 75.5 (L) 12/31/2016   PLT 746 (H) 12/31/2016     STUDIES: Mr Jeri Cos NW Contrast  Result Date: 12/31/2016 CLINICAL DATA:  New diagnosis with lung cancer. Balance disturbance over the last month. Staging. EXAM: MRI HEAD WITHOUT AND WITH CONTRAST TECHNIQUE: Multiplanar, multiecho pulse sequences of the brain and surrounding structures were obtained without and with intravenous contrast. CONTRAST:  39m MULTIHANCE GADOBENATE DIMEGLUMINE 529 MG/ML IV SOLN COMPARISON:  None. FINDINGS: Brain: Brainstem shows chronic small-vessel ischemic changes. No focal cerebellar insult. Within the cerebral hemispheres, there is a subcentimeter focus of restricted diffusion in the deep white matter adjacent to the posterior body of the left lateral ventricle most consistent with a subacute infarction. There are mild chronic small-vessel ischemic changes elsewhere throughout the hemispheric white matter. After contrast administration, there is minimal linear  enhancement in this location, most typical of subacute infarction. This does not look like a rounded mass typical of a metastasis. There is no vasogenic edema. However, there are other small foci of contrast enhancement seen within both cerebral hemispheres that raise concern for small metastases. The differential diagnosis does include other small micro embolic infarctions which are older, but there is no restricted diffusion and I favor, particularly given the primary chest lesion in this case, that we are dealing with early cerebral metastatic disease. Lesions are as follows. Axial image 32.  6 mm focus right frontal subcortical white matter. Axial image 40.  6 mm focus medial left parietal lobe. Axial image 39.  2 mm focus right posterior frontal cortex. Axial image 37.  4 mm focus left frontal cortex. No hydrocephalus. No extra-axial collection. No skull or skullbase lesion identified. Vascular: Major vessels at the base of the brain show flow. Skull and upper cervical spine: Negative Sinuses/Orbits: Clear/normal Other: None significant IMPRESSION: Indeterminate study, but with suspicion of small/early metastatic disease to the brain. Subcentimeter region of restricted diffusion in the deep white matter adjacent to the posterior body of the left lateral ventricle that is most consistent with a subacute white matter infarction. The patient does have small-vessel changes elsewhere throughout the brain. Other scattered foci as listed above which could either be older subacute infarctions which no longer show restricted diffusion or could represent small metastatic lesions. Small metastatic lesions are favored for those foci. I realize this presents a treatment and diagnostic dilemma at this point, but my presumption is that these are small metastases. Repeat scanning could be done in 4 weeks to assess for any change. None of the lesions are associated with vasogenic edema or mass effect. Electronically Signed    By: MNelson ChimesM.D.   On: 12/31/2016 09:30   Nm Pet Image Initial (pi) Skull Base To Thigh  Result Date: 12/07/2016 CLINICAL DATA:  Evaluation of a right upper lobe mass. EXAM: NUCLEAR MEDICINE PET SKULL BASE TO THIGH TECHNIQUE: 12.32 mCi F-18 FDG was injected intravenously. Full-ring PET imaging was performed from the skull base to thigh after the radiotracer. CT data was obtained and used for attenuation correction and anatomic localization. FASTING BLOOD GLUCOSE:  Value: 92 mg/dl COMPARISON:  CT scan of the abdomen October 07, 2016. CT scan of the chest November 23, 2016 FINDINGS: NECK  Muscular uptake is seen in the neck bilaterally, right greater than left. Metastatic nodes are seen in the base of the neck on the right. At least 3 discrete nodes are identified with the largest seen on CT image series 3, image 65 measuring 15 by 12 mm with a maximum SUV of 17.8. CHEST The dominant mass in the right upper lobe with invasion into the chest wall and erosion of the adjacent second rib is again identified. The mass measures 6.5 by 5.4 cm on CT image 87 with a maximum SUV of 28.5 and central necrosis. Just inferior to the dominant mass is nodularity extending from the hilum to the periphery of the right lung as seen on CT images 95 through 102. This nodularity is all FDG avid. Small nodes in the anterior right lung on CT images110 demonstrate mild FDG uptake. A small nodule in the posterior right lung on image 113 demonstrates mild FDG uptake. The largest nodule in the left in the lingula on image 110 demonstrates abnormal FDG uptake with a maximum SUV of 4.7. This nodule measures 11 by 8.4 mm. Other opacities posteriorly within the lungs have an infectious appearance. There is a soft tissue nodule in the subpleural fat medially in the right apex on CT image 76 which is FDG avid. There is mild uptake in the region of the manubrium on image 90 which is suspicious. It is unclear whether this is bony or soft tissue  in origin. There is abnormal uptake in T1 which is consistent with metastatic disease. An MRI could better assess. Extension into the canal is not excluded on PET imaging. No other mediastinal or hilar disease identified. There is a subcutaneous nodule deep to the right latissimus dorsi on CT image 136 which is FDG avid. ABDOMEN/PELVIS A subcutaneous nodule on the right lateral abdominal wall on CT image 160 is mildly FDG avid. There is mild uptake in the left adrenal gland, asymmetric to the right, with no CT correlate. Uptake throughout the colon is likely physiologic. There is uptake in the distal left ureter which is likely normal. Uptake in the right paraspinous musculature on image 183 is nonspecific. Recommend attention on follow-up. There is uptake in the left gluteus muscle on image wall 271 with a maximum SUV of 5. SKELETON Abnormal uptake mid T1 vertebral body with no CT correlate. Bony destruction of the right second rib. IMPRESSION: 1. The mass in the right upper lobe is FDG avid, consistent with malignancy. The mass invades the chest wall and erodes the second rib. Bilateral pulmonary nodules are FDG avid concerning for metastatic disease. Metastatic nodes in the right side of the neck are noted. Metastatic disease to T1 is identified. An MRI could better assess extent of the T1 metastatic lesion. Canal extension not excluded on PET imaging. Subcutaneous and muscular soft tissue nodules in the chest, abdomen, and gluteus region are all consistent with metastatic disease. There is mild focal uptake in the left adrenal gland with no CT correlate. This is indeterminate but an early metastatic lesion is not excluded. Recommend attention in this region on follow-up. There is a metastatic node in the right medial subpleural space and metastatic disease either in or adjacent to the right side of the manubrium. Electronically Signed   By: Dorise Bullion III M.D   On: 12/07/2016 14:58   US Biopsy  Result  Date: 12/14/2016 CLINICAL DATA:  Large right upper lobe lung mass with abnormal right supraclavicular lymph nodes by PET scan. The  patient presents for ultrasound-guided biopsy of one of the supraclavicular lymph nodes. EXAM: ULTRASOUND GUIDED CORE BIOPSY OF RIGHT SUPRACLAVICULAR LYMPH NODE COMPARISON:  PET scan on 12/07/2016 MEDICATIONS: 1.5 mg IV Versed; 50 mcg IV Fentanyl Total Moderate Sedation Time: 15 minutes. The patient's level of consciousness and physiologic status were continuously monitored during the procedure by Radiology nursing. PROCEDURE: The procedure, risks, benefits, and alternatives were explained to the patient. Questions regarding the procedure were encouraged and answered. The patient understands and consents to the procedure. A time out was performed prior to initiating the procedure. The right neck was prepped with chlorhexidine in a sterile fashion, and a sterile drape was applied covering the operative field. A sterile gown and sterile gloves were used for the procedure. Local anesthesia was provided with 1% Lidocaine. Ultrasound was used to localize right-sided supraclavicular/lower cervical lymph node tissue. Under ultrasound guidance, a total of 518 gauge core biopsy samples were obtained and samples submitted in formalin. COMPLICATIONS: None. FINDINGS: Ill-defined soft tissue in the right supraclavicular region was identified which appears to correlate anatomically to the abnormal lymph node seen by PET scan. Solid tissue was obtained. IMPRESSION: Ultrasound-guided core biopsy performed of abnormal appearing soft tissue in the right supraclavicular neck which appears to correspond to the abnormal hypermetabolic lymph nodes seen by PET scan. Electronically Signed   By: Aletta Edouard M.D.   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  PLAN:    1. Stage IVa squamous cell carcinoma of the right upper lobe  lung: Patient noted to have metastatic disease in bilateral lungs as well as on T1 vertebrae. MRI the brain reviewed independently and reported as above with multiple scattered lesions too small to determine whether they are metastatic disease or not. Will repeat MRI in 2-3 months. Patient was supposed to initiate chemotherapy today, but given his declining performance status secondary to pain this will be delayed. He will initiate daily XRT on January 05, 2017. Return to clinic on January 06, 2017 for consideration of cycle 1, then return to clinic one week later on January 13, 2017 for further evaluation and consideration of cycle 2.  2. Flank pain: Likely secondary to lateral chest wall invasion of lung mass. Patient was given a prescription for fentanyl patch today. Continue Percocet as needed. XRT as above.  3. Bladder cancer: Patient known to have high-grade noninvasive urothelial carcinoma. His treatment with carboplatinum may be of some benefit, but in the future can consider a chemotherapeutic regimen for his lung cancer that would also benefit bladder cancer such as cisplatin and gemcitabine.   Approximately 30 minutes was spent in discussion of which greater than 50% was consultation.  Patient expressed understanding and was in agreement with this plan. He also understands that He can call clinic at any time with any questions, concerns, or complaints.   Cancer Staging Primary lung squamous cell carcinoma, right [facility]) Staging form: Lung, AJCC 8th Edition - Clinical stage from 12/23/2016: Stage IVA (cT3, cN3, pM1a) - Signed by Lloyd Huger, MD on 12/23/2016   Lloyd Huger, MD   01/03/2017 8:15 AM

## 2016-12-31 ENCOUNTER — Inpatient Hospital Stay: Payer: Medicare Other

## 2016-12-31 ENCOUNTER — Ambulatory Visit
Admission: RE | Admit: 2016-12-31 | Discharge: 2016-12-31 | Disposition: A | Discharge status: Home / Self Care | Payer: Medicare Other | Source: Ambulatory Visit | Attending: Oncology | Admitting: Oncology

## 2016-12-31 ENCOUNTER — Inpatient Hospital Stay (HOSPITAL_BASED_OUTPATIENT_CLINIC_OR_DEPARTMENT_OTHER): Payer: Medicare Other | Admitting: Oncology

## 2016-12-31 VITALS — BP 118/62 | HR 87 | Temp 96.6°F | Resp 18 | Wt 159.8 lb

## 2016-12-31 DIAGNOSIS — Z87891 Personal history of nicotine dependence: Secondary | ICD-10-CM

## 2016-12-31 DIAGNOSIS — R938 Abnormal findings on diagnostic imaging of other specified body structures: Secondary | ICD-10-CM | POA: Insufficient documentation

## 2016-12-31 DIAGNOSIS — E119 Type 2 diabetes mellitus without complications: Secondary | ICD-10-CM | POA: Diagnosis not present

## 2016-12-31 DIAGNOSIS — Z85828 Personal history of other malignant neoplasm of skin: Secondary | ICD-10-CM

## 2016-12-31 DIAGNOSIS — M25511 Pain in right shoulder: Secondary | ICD-10-CM | POA: Diagnosis not present

## 2016-12-31 DIAGNOSIS — Z7984 Long term (current) use of oral hypoglycemic drugs: Secondary | ICD-10-CM

## 2016-12-31 DIAGNOSIS — Z803 Family history of malignant neoplasm of breast: Secondary | ICD-10-CM

## 2016-12-31 DIAGNOSIS — C3411 Malignant neoplasm of upper lobe, right bronchus or lung: Secondary | ICD-10-CM | POA: Diagnosis not present

## 2016-12-31 DIAGNOSIS — J449 Chronic obstructive pulmonary disease, unspecified: Secondary | ICD-10-CM

## 2016-12-31 DIAGNOSIS — C3491 Malignant neoplasm of unspecified part of right bronchus or lung: Secondary | ICD-10-CM

## 2016-12-31 DIAGNOSIS — C679 Malignant neoplasm of bladder, unspecified: Secondary | ICD-10-CM

## 2016-12-31 DIAGNOSIS — C7951 Secondary malignant neoplasm of bone: Secondary | ICD-10-CM | POA: Diagnosis not present

## 2016-12-31 DIAGNOSIS — R0602 Shortness of breath: Secondary | ICD-10-CM

## 2016-12-31 DIAGNOSIS — R109 Unspecified abdominal pain: Secondary | ICD-10-CM | POA: Diagnosis not present

## 2016-12-31 DIAGNOSIS — F419 Anxiety disorder, unspecified: Secondary | ICD-10-CM

## 2016-12-31 DIAGNOSIS — I10 Essential (primary) hypertension: Secondary | ICD-10-CM

## 2016-12-31 DIAGNOSIS — C7802 Secondary malignant neoplasm of left lung: Secondary | ICD-10-CM | POA: Diagnosis not present

## 2016-12-31 DIAGNOSIS — E785 Hyperlipidemia, unspecified: Secondary | ICD-10-CM | POA: Diagnosis not present

## 2016-12-31 DIAGNOSIS — Z87442 Personal history of urinary calculi: Secondary | ICD-10-CM

## 2016-12-31 DIAGNOSIS — Z79899 Other long term (current) drug therapy: Secondary | ICD-10-CM

## 2016-12-31 DIAGNOSIS — R918 Other nonspecific abnormal finding of lung field: Secondary | ICD-10-CM | POA: Diagnosis present

## 2016-12-31 DIAGNOSIS — Z7982 Long term (current) use of aspirin: Secondary | ICD-10-CM

## 2016-12-31 DIAGNOSIS — C7801 Secondary malignant neoplasm of right lung: Secondary | ICD-10-CM | POA: Diagnosis not present

## 2016-12-31 DIAGNOSIS — G4730 Sleep apnea, unspecified: Secondary | ICD-10-CM

## 2016-12-31 LAB — CBC WITH DIFFERENTIAL/PLATELET
BASOS ABS: 0.1 10*3/uL (ref 0–0.1)
Basophils Relative: 1 %
EOS PCT: 2 %
Eosinophils Absolute: 0.4 10*3/uL (ref 0–0.7)
HEMATOCRIT: 25.4 % — AB (ref 40.0–52.0)
Hemoglobin: 8 g/dL — ABNORMAL LOW (ref 13.0–18.0)
LYMPHS ABS: 2.1 10*3/uL (ref 1.0–3.6)
LYMPHS PCT: 13 %
MCH: 23.8 pg — AB (ref 26.0–34.0)
MCHC: 31.6 g/dL — AB (ref 32.0–36.0)
MCV: 75.5 fL — AB (ref 80.0–100.0)
MONO ABS: 1 10*3/uL (ref 0.2–1.0)
MONOS PCT: 6 %
NEUTROS ABS: 12.8 10*3/uL — AB (ref 1.4–6.5)
Neutrophils Relative %: 78 %
PLATELETS: 746 10*3/uL — AB (ref 150–440)
RBC: 3.37 MIL/uL — ABNORMAL LOW (ref 4.40–5.90)
RDW: 17.7 % — AB (ref 11.5–14.5)
WBC: 16.3 10*3/uL — ABNORMAL HIGH (ref 3.8–10.6)

## 2016-12-31 LAB — COMPREHENSIVE METABOLIC PANEL
ALT: 11 U/L — ABNORMAL LOW (ref 17–63)
AST: 18 U/L (ref 15–41)
Albumin: 2.9 g/dL — ABNORMAL LOW (ref 3.5–5.0)
Alkaline Phosphatase: 71 U/L (ref 38–126)
Anion gap: 10 (ref 5–15)
BILIRUBIN TOTAL: 0.2 mg/dL — AB (ref 0.3–1.2)
BUN: 22 mg/dL — AB (ref 6–20)
CHLORIDE: 94 mmol/L — AB (ref 101–111)
CO2: 28 mmol/L (ref 22–32)
Calcium: 10.2 mg/dL (ref 8.9–10.3)
Creatinine, Ser: 1.02 mg/dL (ref 0.61–1.24)
Glucose, Bld: 139 mg/dL — ABNORMAL HIGH (ref 65–99)
POTASSIUM: 4.3 mmol/L (ref 3.5–5.1)
Sodium: 132 mmol/L — ABNORMAL LOW (ref 135–145)
TOTAL PROTEIN: 7.8 g/dL (ref 6.5–8.1)

## 2016-12-31 MED ORDER — FENTANYL 25 MCG/HR TD PT72: 25.0000 ug | MEDICATED_PATCH | TRANSDERMAL | 0 refills | Status: DC

## 2016-12-31 MED ORDER — GADOBENATE DIMEGLUMINE 529 MG/ML IV SOLN
15.0000 mL | Freq: Once | INTRAVENOUS | Status: AC | PRN
  Administered 2016-12-31: 15 mL via INTRAVENOUS

## 2016-12-31 MED ORDER — OXYCODONE-ACETAMINOPHEN 5-325 MG PO TABS
1.0000 | ORAL_TABLET | ORAL | 0 refills | Status: DC | PRN
Start: 2016-12-31 — End: 2017-01-15

## 2016-12-31 NOTE — Progress Notes (Signed)
Complains of generalized pain all over but is better after taking pain meds this morning. Pt requests refill of both pain meds today.

## 2017-01-05 ENCOUNTER — Ambulatory Visit
Admission: RE | Admit: 2017-01-05 | Discharge: 2017-01-05 | Disposition: A | Discharge status: Home / Self Care | Payer: Medicare Other | Source: Ambulatory Visit | Attending: Radiation Oncology | Admitting: Radiation Oncology

## 2017-01-05 ENCOUNTER — Other Ambulatory Visit: Payer: Self-pay | Admitting: Oncology

## 2017-01-05 DIAGNOSIS — C3411 Malignant neoplasm of upper lobe, right bronchus or lung: Secondary | ICD-10-CM | POA: Diagnosis not present

## 2017-01-06 ENCOUNTER — Ambulatory Visit
Admission: RE | Admit: 2017-01-06 | Discharge: 2017-01-06 | Disposition: A | Discharge status: Home / Self Care | Payer: Medicare Other | Source: Ambulatory Visit | Attending: Radiation Oncology | Admitting: Radiation Oncology

## 2017-01-06 ENCOUNTER — Inpatient Hospital Stay: Payer: Medicare Other

## 2017-01-06 ENCOUNTER — Other Ambulatory Visit: Payer: Self-pay | Admitting: *Deleted

## 2017-01-06 ENCOUNTER — Other Ambulatory Visit: Payer: Self-pay | Admitting: Internal Medicine

## 2017-01-06 VITALS — BP 107/67 | HR 78 | Temp 96.9°F | Resp 18

## 2017-01-06 DIAGNOSIS — D500 Iron deficiency anemia secondary to blood loss (chronic): Secondary | ICD-10-CM | POA: Insufficient documentation

## 2017-01-06 DIAGNOSIS — C3491 Malignant neoplasm of unspecified part of right bronchus or lung: Secondary | ICD-10-CM

## 2017-01-06 DIAGNOSIS — C3411 Malignant neoplasm of upper lobe, right bronchus or lung: Secondary | ICD-10-CM | POA: Diagnosis not present

## 2017-01-06 LAB — CBC WITH DIFFERENTIAL/PLATELET
BASOS PCT: 1 %
Basophils Absolute: 0.1 10*3/uL (ref 0–0.1)
EOS ABS: 0.5 10*3/uL (ref 0–0.7)
Eosinophils Relative: 3 %
HEMATOCRIT: 24.7 % — AB (ref 40.0–52.0)
HEMOGLOBIN: 7.7 g/dL — AB (ref 13.0–18.0)
LYMPHS ABS: 2.2 10*3/uL (ref 1.0–3.6)
Lymphocytes Relative: 13 %
MCH: 23.5 pg — ABNORMAL LOW (ref 26.0–34.0)
MCHC: 31.2 g/dL — ABNORMAL LOW (ref 32.0–36.0)
MCV: 75.4 fL — ABNORMAL LOW (ref 80.0–100.0)
Monocytes Absolute: 1.2 10*3/uL — ABNORMAL HIGH (ref 0.2–1.0)
Monocytes Relative: 7 %
NEUTROS ABS: 12.6 10*3/uL — AB (ref 1.4–6.5)
NEUTROS PCT: 76 %
Platelets: 672 10*3/uL — ABNORMAL HIGH (ref 150–440)
RBC: 3.28 MIL/uL — AB (ref 4.40–5.90)
RDW: 17.7 % — ABNORMAL HIGH (ref 11.5–14.5)
WBC: 16.6 10*3/uL — ABNORMAL HIGH (ref 3.8–10.6)

## 2017-01-06 LAB — COMPREHENSIVE METABOLIC PANEL
ALT: 10 U/L — ABNORMAL LOW (ref 17–63)
ANION GAP: 9 (ref 5–15)
AST: 22 U/L (ref 15–41)
Albumin: 2.6 g/dL — ABNORMAL LOW (ref 3.5–5.0)
Alkaline Phosphatase: 70 U/L (ref 38–126)
BILIRUBIN TOTAL: 0.1 mg/dL — AB (ref 0.3–1.2)
BUN: 26 mg/dL — AB (ref 6–20)
CO2: 33 mmol/L — ABNORMAL HIGH (ref 22–32)
Calcium: 10.8 mg/dL — ABNORMAL HIGH (ref 8.9–10.3)
Chloride: 93 mmol/L — ABNORMAL LOW (ref 101–111)
Creatinine, Ser: 1.09 mg/dL (ref 0.61–1.24)
Glucose, Bld: 104 mg/dL — ABNORMAL HIGH (ref 65–99)
POTASSIUM: 4.5 mmol/L (ref 3.5–5.1)
Sodium: 135 mmol/L (ref 135–145)
Total Protein: 7.7 g/dL (ref 6.5–8.1)

## 2017-01-06 LAB — SAMPLE TO BLOOD BANK

## 2017-01-06 LAB — ABO/RH: ABO/RH(D): A POS

## 2017-01-06 LAB — PREPARE RBC (CROSSMATCH)

## 2017-01-06 MED ORDER — DIPHENHYDRAMINE HCL 50 MG/ML IJ SOLN
25.0000 mg | Freq: Once | INTRAMUSCULAR | Status: AC
  Administered 2017-01-06: 25 mg via INTRAVENOUS
  Filled 2017-01-06: qty 1

## 2017-01-06 MED ORDER — SODIUM CHLORIDE 0.9 % IV SOLN: 10.0000 mg | Freq: Once | INTRAVENOUS | Status: DC

## 2017-01-06 MED ORDER — CARBOPLATIN CHEMO INJECTION 450 MG/45ML
170.6000 mg | Freq: Once | INTRAVENOUS | Status: AC
  Administered 2017-01-06: 170 mg via INTRAVENOUS
  Filled 2017-01-06: qty 17

## 2017-01-06 MED ORDER — SODIUM CHLORIDE 0.9 % IV SOLN
45.0000 mg/m2 | Freq: Once | INTRAVENOUS | Status: AC
  Administered 2017-01-06: 84 mg via INTRAVENOUS
  Filled 2017-01-06: qty 14

## 2017-01-06 MED ORDER — DEXAMETHASONE SODIUM PHOSPHATE 10 MG/ML IJ SOLN
10.0000 mg | Freq: Once | INTRAMUSCULAR | Status: AC
  Administered 2017-01-06: 10 mg via INTRAVENOUS
  Filled 2017-01-06: qty 1

## 2017-01-06 MED ORDER — FAMOTIDINE IN NACL 20-0.9 MG/50ML-% IV SOLN
20.0000 mg | Freq: Once | INTRAVENOUS | Status: AC
  Administered 2017-01-06: 20 mg via INTRAVENOUS
  Filled 2017-01-06: qty 50

## 2017-01-06 MED ORDER — HEPARIN SOD (PORK) LOCK FLUSH 100 UNIT/ML IV SOLN
500.0000 [IU] | Freq: Once | INTRAVENOUS | Status: AC
  Administered 2017-01-06: 500 [IU] via INTRAVENOUS

## 2017-01-06 MED ORDER — SODIUM CHLORIDE 0.9 % IV SOLN
Freq: Once | INTRAVENOUS | Status: AC
  Administered 2017-01-06: 10:00:00 via INTRAVENOUS
  Filled 2017-01-06: qty 1000

## 2017-01-06 MED ORDER — SODIUM CHLORIDE 0.9% FLUSH
10.0000 mL | INTRAVENOUS | Status: AC | PRN
  Administered 2017-01-06: 10 mL via INTRAVENOUS
  Filled 2017-01-06: qty 10

## 2017-01-06 MED ORDER — PALONOSETRON HCL INJECTION 0.25 MG/5ML
0.2500 mg | Freq: Once | INTRAVENOUS | Status: AC
  Administered 2017-01-06: 0.25 mg via INTRAVENOUS
  Filled 2017-01-06: qty 5

## 2017-01-06 NOTE — Progress Notes (Unsigned)
Spoke with Dr. Rogue Bussing regarding patient's labs, hemoglobin 7.7 today and he said to go ahead with carbo/taxol treatment today and get a hold tube for lab and have the patient return to the clinic tomorrow for blood.  LJ

## 2017-01-07 ENCOUNTER — Ambulatory Visit
Admission: RE | Admit: 2017-01-07 | Discharge: 2017-01-07 | Disposition: A | Discharge status: Home / Self Care | Payer: Medicare Other | Source: Ambulatory Visit | Attending: Radiation Oncology | Admitting: Radiation Oncology

## 2017-01-07 ENCOUNTER — Inpatient Hospital Stay: Payer: Medicare Other

## 2017-01-07 DIAGNOSIS — D500 Iron deficiency anemia secondary to blood loss (chronic): Secondary | ICD-10-CM

## 2017-01-07 DIAGNOSIS — C3411 Malignant neoplasm of upper lobe, right bronchus or lung: Secondary | ICD-10-CM | POA: Diagnosis not present

## 2017-01-07 MED ORDER — HEPARIN SOD (PORK) LOCK FLUSH 100 UNIT/ML IV SOLN
500.0000 [IU] | Freq: Once | INTRAVENOUS | Status: AC
  Administered 2017-01-07: 500 [IU] via INTRAVENOUS

## 2017-01-07 MED ORDER — ACETAMINOPHEN 325 MG PO TABS
650.0000 mg | ORAL_TABLET | Freq: Once | ORAL | Status: AC
  Administered 2017-01-07: 650 mg via ORAL
  Filled 2017-01-07: qty 2

## 2017-01-07 MED ORDER — SODIUM CHLORIDE 0.9 % IV SOLN
250.0000 mL | Freq: Once | INTRAVENOUS | Status: AC
  Administered 2017-01-07: 250 mL via INTRAVENOUS
  Filled 2017-01-07: qty 250

## 2017-01-07 MED ORDER — DIPHENHYDRAMINE HCL 25 MG PO CAPS
25.0000 mg | ORAL_CAPSULE | Freq: Once | ORAL | Status: AC
  Administered 2017-01-07: 25 mg via ORAL
  Filled 2017-01-07: qty 1

## 2017-01-07 MED ORDER — SODIUM CHLORIDE 0.9% FLUSH
10.0000 mL | INTRAVENOUS | Status: DC | PRN
  Administered 2017-01-07: 10 mL via INTRAVENOUS
  Filled 2017-01-07: qty 10

## 2017-01-08 ENCOUNTER — Ambulatory Visit
Admission: RE | Admit: 2017-01-08 | Discharge: 2017-01-08 | Disposition: A | Discharge status: Home / Self Care | Payer: Medicare Other | Source: Ambulatory Visit | Attending: Radiation Oncology | Admitting: Radiation Oncology

## 2017-01-08 DIAGNOSIS — C3411 Malignant neoplasm of upper lobe, right bronchus or lung: Secondary | ICD-10-CM | POA: Diagnosis not present

## 2017-01-08 LAB — BPAM RBC
Blood Product Expiration Date: 201803272359
ISSUE DATE / TIME: 201803221142
UNIT TYPE AND RH: 6200

## 2017-01-08 LAB — TYPE AND SCREEN
ABO/RH(D): A POS
ANTIBODY SCREEN: NEGATIVE
Unit division: 0

## 2017-01-11 ENCOUNTER — Ambulatory Visit: Payer: Medicare Other

## 2017-01-12 ENCOUNTER — Ambulatory Visit
Admission: RE | Admit: 2017-01-12 | Discharge: 2017-01-12 | Disposition: A | Discharge status: Home / Self Care | Payer: Medicare Other | Source: Ambulatory Visit | Attending: Radiation Oncology | Admitting: Radiation Oncology

## 2017-01-12 DIAGNOSIS — C3411 Malignant neoplasm of upper lobe, right bronchus or lung: Secondary | ICD-10-CM | POA: Diagnosis not present

## 2017-01-12 NOTE — Progress Notes (Signed)
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  INTERVAL HISTORY: Patient returns to clinic today for further evaluation and consideration of cycle 2 of weekly carboplatinum and Taxol. He is tolerating his daily XRT. He feels more weak and fatigued today, but was noted to have a blood sugar of 37. His pain is better controlled. He has no neurologic complaints. He denies any recent fevers or illnesses. He has a poor appetite, but  denies weight loss. He denies any shortness of breath, cough, or hemoptysis. He denies any nausea, vomiting, constipation, or diarrhea. He has no melena or hematochezia. He has no urinary complaints. Patient otherwise feels well and offers no further specific complaints.  REVIEW OF SYSTEMS:   Review of Systems  Constitutional: Negative for fever and weight loss.  Respiratory: Negative.  Negative for cough and shortness of breath.   Cardiovascular: Positive for chest pain. Negative for leg swelling.  Gastrointestinal: Negative.  Negative for abdominal pain.  Genitourinary: Positive for flank pain.  Neurological: Positive for weakness. Negative for sensory change.  Psychiatric/Behavioral: The patient is nervous/anxious.     As per HPI. Otherwise, a complete review of systems is negative.  PAST MEDICAL HISTORY: Past Medical History:  Diagnosis Date  . Arthritis   . Basal cell carcinoma of skin   . Bladder cancer ([place])   . COPD (chronic obstructive pulmonary disease) ([place])   . Diabetes mellitus without complication ([place])   . Dyspnea    with exertion  . History of kidney stones   . Hyperlipidemia   . Hypertension   . Kidney stone   . Pneumonia   . Sleep apnea   . Squamous cell skin cancer      PAST SURGICAL HISTORY: Past Surgical History:  Procedure Laterality Date  . BLADDER FULGURATION    . HERNIA REPAIR    . PORTA CATH INSERTION N/A 12/28/2016   Procedure: Glori Luis Cath Insertion;  Surgeon: Algernon Huxley, MD;  Location: [facility];  Service: Cardiovascular;  Laterality: N/A;    FAMILY HISTORY: Family History  Problem Relation Age of Onset  . Breast cancer Mother   . Stroke Father   . Diabetes Father   . Prostate cancer Neg Hx   . Bladder Cancer Neg Hx   . Kidney cancer Neg Hx     ADVANCED DIRECTIVES (Y/N):  N  HEALTH MAINTENANCE: Social History  Substance Use Topics  . Smoking status: Former Smoker    Years: 60.00    Quit date: 12/14/2013  . Smokeless tobacco: Current User    Types: Snuff  . Alcohol use No     Colonoscopy:  PAP:  Bone density:  Lipid panel:  No Known Allergies  Current Outpatient Prescriptions  Medication Sig Dispense Refill  . acetaminophen (TYLENOL) 500 MG tablet Take 500 mg by mouth every 6 (six) hours as needed for moderate pain or headache.    . albuterol (PROVENTIL HFA;VENTOLIN HFA) 108 (90 Base) MCG/ACT inhaler Inhale 1-2 puffs into the lungs every 6 (six) hours as needed for wheezing or shortness of breath.     Marland Kitchen albuterol (PROVENTIL) (2.5 MG/3ML) 0.083% nebulizer solution Take 2.5 mg by nebulization every 4 (four) hours as needed for wheezing or shortness of breath.     Marland Kitchen  aspirin 81 MG chewable tablet TAKE 1 TABLET (81 MG) BY MOUTH AT NIGHT    . Cholecalciferol (VITAMIN D) 2000 units CAPS Take 2,000 Units by mouth daily.    . diphenhydramine-acetaminophen (TYLENOL PM) 25-500 MG TABS tablet Take 1-2 tablets by mouth at bedtime as needed (sleep).    Marland Kitchen glimepiride (AMARYL) 2 MG tablet Take 2 mg by mouth daily.     Marland Kitchen lidocaine-prilocaine (EMLA) cream Apply to affected area once 30 g 3  . lisinopril (PRINIVIL,ZESTRIL) 40 MG tablet Take 40 mg by mouth daily.    . metFORMIN (GLUCOPHAGE-XR) 500 MG 24 hr tablet Take 1,000 mg  by mouth 2 (two) times daily.     . Omega-3 Fatty Acids (FISH OIL) 1200 MG CAPS Take 1,200 mg by mouth daily.    . ondansetron (ZOFRAN) 8 MG tablet Take 1 tablet (8 mg total) by mouth 2 (two) times daily as needed for refractory nausea / vomiting. 60 tablet 2  . oxyCODONE-acetaminophen (PERCOCET/ROXICET) 5-325 MG tablet Take 1 tablet by mouth every 4 (four) hours as needed for severe pain. 90 tablet 0  . OXYGEN Inhale 2 L into the lungs continuous.    . prochlorperazine (COMPAZINE) 10 MG tablet Take 1 tablet (10 mg total) by mouth every 6 (six) hours as needed (Nausea or vomiting). 60 tablet 2  . simvastatin (ZOCOR) 40 MG tablet Take 40 mg by mouth daily.     . SYMBICORT 160-4.5 MCG/ACT inhaler Inhale 2 puffs into the lungs 2 (two) times daily.    . tamsulosin (FLOMAX) 0.4 MG CAPS capsule TAKE 1 TABLET BY MOUTH EVERY NIGHT BEFORE BEDTIME    . tiotropium (SPIRIVA HANDIHALER) 18 MCG inhalation capsule Place 18 mcg into inhaler and inhale at bedtime.     . traMADol (ULTRAM) 50 MG tablet Take 50-100 mg by mouth every 6 (six) hours as needed (for pain.).     Marland Kitchen colchicine 0.6 MG tablet Take 0.6 mg by mouth daily.    . fentaNYL (DURAGESIC - DOSED MCG/HR) 25 MCG/HR patch Place 1 patch (25 mcg total) onto the skin every 3 (three) days. (Patient not taking: Reported on 01/13/2017) 10 patch 0  . sucralfate (CARAFATE) 1 g tablet Take 1 tablet (1 g total) by mouth 3 (three) times daily. Dissolve tablet in water, swish and swallow (Patient not taking: Reported on 01/13/2017) 90 tablet 3   No current facility-administered medications for this visit.    Facility-Administered Medications Ordered in Other Visits  Medication Dose Route Frequency Provider Last Rate Last Dose  . sodium chloride flush (NS) 0.9 % injection 10 mL  10 mL Intravenous PRN Lloyd Huger, MD   10 mL at 01/06/17 0830    OBJECTIVE: Vitals:   01/13/17 0855  BP: 112/68  Pulse: 92  Resp: 18  Temp: 99.5 F (37.5 C)     Body mass index  is 25.68 kg/m.    ECOG FS:2 - Symptomatic, <50% confined to bed  General: Well-developed, well-nourished, no acute distress. Eyes: Pink conjunctiva, anicteric sclera. Lungs: Clear to auscultation bilaterally. Heart: Regular rate and rhythm. No rubs, murmurs, or gallops. Abdomen: Soft, nontender, nondistended. No organomegaly noted, normoactive bowel sounds. Musculoskeletal: No edema, cyanosis, or clubbing. Neuro: Alert, answering all questions appropriately. Cranial nerves grossly intact. Skin: No rashes or petechiae noted. Psych: Normal affect.   LAB RESULTS:  Lab Results  Component Value Date   NA 133 (L) 01/13/2017   K 4.6 01/13/2017   CL 93 (L) 01/13/2017  CO2 34 (H) 01/13/2017   GLUCOSE 36 (LL) 01/13/2017   BUN 25 (H) 01/13/2017   CREATININE 0.88 01/13/2017   CALCIUM 10.4 (H) 01/13/2017   PROT 7.4 01/13/2017   ALBUMIN 2.6 (L) 01/13/2017   AST 19 01/13/2017   ALT 10 (L) 01/13/2017   ALKPHOS 68 01/13/2017   BILITOT 0.3 01/13/2017   GFRNONAA >60 01/13/2017   GFRAA >60 01/13/2017    Lab Results  Component Value Date   WBC 12.6 (H) 01/13/2017   NEUTROABS 10.3 (H) 01/13/2017   HGB 8.3 (L) 01/13/2017   HCT 26.5 (L) 01/13/2017   MCV 75.8 (L) 01/13/2017   PLT 606 (H) 01/13/2017     STUDIES: Mr Jeri Cos ZO Contrast  Result Date: 12/31/2016 CLINICAL DATA:  New diagnosis with lung cancer. Balance disturbance over the last month. Staging. EXAM: MRI HEAD WITHOUT AND WITH CONTRAST TECHNIQUE: Multiplanar, multiecho pulse sequences of the brain and surrounding structures were obtained without and with intravenous contrast. CONTRAST:  72m MULTIHANCE GADOBENATE DIMEGLUMINE 529 MG/ML IV SOLN COMPARISON:  None. FINDINGS: Brain: Brainstem shows chronic small-vessel ischemic changes. No focal cerebellar insult. Within the cerebral hemispheres, there is a subcentimeter focus of restricted diffusion in the deep white matter adjacent to the posterior body of the left lateral ventricle  most consistent with a subacute infarction. There are mild chronic small-vessel ischemic changes elsewhere throughout the hemispheric white matter. After contrast administration, there is minimal linear enhancement in this location, most typical of subacute infarction. This does not look like a rounded mass typical of a metastasis. There is no vasogenic edema. However, there are other small foci of contrast enhancement seen within both cerebral hemispheres that raise concern for small metastases. The differential diagnosis does include other small micro embolic infarctions which are older, but there is no restricted diffusion and I favor, particularly given the primary chest lesion in this case, that we are dealing with early cerebral metastatic disease. Lesions are as follows. Axial image 32.  6 mm focus right frontal subcortical white matter. Axial image 40.  6 mm focus medial left parietal lobe. Axial image 39.  2 mm focus right posterior frontal cortex. Axial image 37.  4 mm focus left frontal cortex. No hydrocephalus. No extra-axial collection. No skull or skullbase lesion identified. Vascular: Major vessels at the base of the brain show flow. Skull and upper cervical spine: Negative Sinuses/Orbits: Clear/normal Other: None significant IMPRESSION: Indeterminate study, but with suspicion of small/early metastatic disease to the brain. Subcentimeter region of restricted diffusion in the deep white matter adjacent to the posterior body of the left lateral ventricle that is most consistent with a subacute white matter infarction. The patient does have small-vessel changes elsewhere throughout the brain. Other scattered foci as listed above which could either be older subacute infarctions which no longer show restricted diffusion or could represent small metastatic lesions. Small metastatic lesions are favored for those foci. I realize this presents a treatment and diagnostic dilemma at this point, but my  presumption is that these are small metastases. Repeat scanning could be done in 4 weeks to assess for any change. None of the lesions are associated with vasogenic edema or mass effect. Electronically Signed   By: MNelson ChimesM.D.   On: 12/31/2016 09:30   [place]  Result Date: 12/14/2016 CLINICAL DATA:  Large right upper lobe lung mass with abnormal right supraclavicular lymph nodes by PET scan. The patient presents for ultrasound-guided biopsy of one of the supraclavicular lymph nodes.  EXAM: ULTRASOUND GUIDED CORE BIOPSY OF RIGHT SUPRACLAVICULAR LYMPH NODE COMPARISON:  PET scan on 12/07/2016 MEDICATIONS: 1.5 mg IV Versed; 50 mcg IV Fentanyl Total Moderate Sedation Time: 15 minutes. The patient's level of consciousness and physiologic status were continuously monitored during the procedure by Radiology nursing. PROCEDURE: The procedure, risks, benefits, and alternatives were explained to the patient. Questions regarding the procedure were encouraged and answered. The patient understands and consents to the procedure. A time out was performed prior to initiating the procedure. The right neck was prepped with chlorhexidine in a sterile fashion, and a sterile drape was applied covering the operative field. A sterile gown and sterile gloves were used for the procedure. Local anesthesia was provided with 1% Lidocaine. Ultrasound was used to localize right-sided supraclavicular/lower cervical lymph node tissue. Under ultrasound guidance, a total of 518 gauge core biopsy samples were obtained and samples submitted in formalin. COMPLICATIONS: None. FINDINGS: Ill-defined soft tissue in the right supraclavicular region was identified which appears to correlate anatomically to the abnormal lymph node seen by PET scan. Solid tissue was obtained. IMPRESSION: Ultrasound-guided core biopsy performed of abnormal appearing soft tissue in the right supraclavicular neck which appears to correspond to the abnormal  hypermetabolic lymph nodes seen by PET scan. Electronically Signed   By: Aletta Edouard M.D.   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  PLAN:    1. 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 MRI the brain reviewed independently and reported as above with multiple scattered lesions too small to determine whether they are metastatic disease or not. Will repeat MRI in 2-3 months. Continue daily XRT. Proceed with cycle 2 of weekly carboplatinum and Taxol. Return to clinic in 1 week for consideration of cycle 3. 2. Flank pain: Likely secondary to lateral chest wall invasion of lung mass. Improving. Patient is no longer using his fentanyl patch and only takes his other narcotics sparingly. Continue XRT as above.  3. Bladder cancer: Patient known to have high-grade noninvasive urothelial carcinoma. His treatment with carboplatinum may be of some benefit, but in the future can consider a chemotherapeutic regimen for his lung cancer that would also benefit bladder cancer such as cisplatin and gemcitabine.  4. Hyperglycemia: Patient did not eat or take his metformin this morning. Improved with food. Patient was given a dietary consult.  Patient expressed understanding and was in agreement with this plan. He also understands that He can call clinic at any time with any questions, concerns, or complaints.   Cancer Staging Primary lung squamous cell carcinoma, right [facility]) Staging form: Lung, AJCC 8th Edition - Clinical stage from 12/23/2016: Stage IVA (cT3, cN3, pM1a) - Signed by Lloyd Huger, MD on 12/23/2016   Lloyd Huger, MD   01/13/2017 9:17 AM

## 2017-01-13 ENCOUNTER — Inpatient Hospital Stay (HOSPITAL_BASED_OUTPATIENT_CLINIC_OR_DEPARTMENT_OTHER): Payer: Medicare Other | Admitting: Oncology

## 2017-01-13 ENCOUNTER — Inpatient Hospital Stay: Payer: Medicare Other

## 2017-01-13 ENCOUNTER — Ambulatory Visit
Admission: RE | Admit: 2017-01-13 | Discharge: 2017-01-13 | Disposition: A | Discharge status: Home / Self Care | Payer: Medicare Other | Source: Ambulatory Visit | Attending: Radiation Oncology | Admitting: Radiation Oncology

## 2017-01-13 ENCOUNTER — Encounter: Payer: Self-pay | Admitting: Oncology

## 2017-01-13 VITALS — BP 112/68 | HR 92 | Temp 99.5°F | Resp 18 | Ht 65.0 in | Wt 154.3 lb

## 2017-01-13 DIAGNOSIS — F419 Anxiety disorder, unspecified: Secondary | ICD-10-CM

## 2017-01-13 DIAGNOSIS — Z7982 Long term (current) use of aspirin: Secondary | ICD-10-CM

## 2017-01-13 DIAGNOSIS — C7801 Secondary malignant neoplasm of right lung: Secondary | ICD-10-CM | POA: Diagnosis not present

## 2017-01-13 DIAGNOSIS — C7951 Secondary malignant neoplasm of bone: Secondary | ICD-10-CM

## 2017-01-13 DIAGNOSIS — M25511 Pain in right shoulder: Secondary | ICD-10-CM | POA: Diagnosis not present

## 2017-01-13 DIAGNOSIS — J449 Chronic obstructive pulmonary disease, unspecified: Secondary | ICD-10-CM

## 2017-01-13 DIAGNOSIS — E785 Hyperlipidemia, unspecified: Secondary | ICD-10-CM

## 2017-01-13 DIAGNOSIS — C679 Malignant neoplasm of bladder, unspecified: Secondary | ICD-10-CM | POA: Diagnosis not present

## 2017-01-13 DIAGNOSIS — R531 Weakness: Secondary | ICD-10-CM

## 2017-01-13 DIAGNOSIS — C3491 Malignant neoplasm of unspecified part of right bronchus or lung: Secondary | ICD-10-CM

## 2017-01-13 DIAGNOSIS — Z87442 Personal history of urinary calculi: Secondary | ICD-10-CM

## 2017-01-13 DIAGNOSIS — Z7984 Long term (current) use of oral hypoglycemic drugs: Secondary | ICD-10-CM

## 2017-01-13 DIAGNOSIS — C3411 Malignant neoplasm of upper lobe, right bronchus or lung: Secondary | ICD-10-CM | POA: Diagnosis not present

## 2017-01-13 DIAGNOSIS — R109 Unspecified abdominal pain: Secondary | ICD-10-CM | POA: Diagnosis not present

## 2017-01-13 DIAGNOSIS — C7802 Secondary malignant neoplasm of left lung: Secondary | ICD-10-CM

## 2017-01-13 DIAGNOSIS — Z79899 Other long term (current) drug therapy: Secondary | ICD-10-CM

## 2017-01-13 DIAGNOSIS — G4730 Sleep apnea, unspecified: Secondary | ICD-10-CM

## 2017-01-13 DIAGNOSIS — Z87891 Personal history of nicotine dependence: Secondary | ICD-10-CM

## 2017-01-13 DIAGNOSIS — Z803 Family history of malignant neoplasm of breast: Secondary | ICD-10-CM

## 2017-01-13 DIAGNOSIS — Z85828 Personal history of other malignant neoplasm of skin: Secondary | ICD-10-CM

## 2017-01-13 DIAGNOSIS — E1165 Type 2 diabetes mellitus with hyperglycemia: Secondary | ICD-10-CM

## 2017-01-13 DIAGNOSIS — R0602 Shortness of breath: Secondary | ICD-10-CM

## 2017-01-13 DIAGNOSIS — R5383 Other fatigue: Secondary | ICD-10-CM

## 2017-01-13 DIAGNOSIS — I10 Essential (primary) hypertension: Secondary | ICD-10-CM

## 2017-01-13 LAB — CBC WITH DIFFERENTIAL/PLATELET
BASOS ABS: 0.1 10*3/uL (ref 0–0.1)
Basophils Relative: 1 %
Eosinophils Absolute: 0.2 10*3/uL (ref 0–0.7)
Eosinophils Relative: 2 %
HCT: 26.5 % — ABNORMAL LOW (ref 40.0–52.0)
HEMOGLOBIN: 8.3 g/dL — AB (ref 13.0–18.0)
LYMPHS ABS: 1.1 10*3/uL (ref 1.0–3.6)
LYMPHS PCT: 8 %
MCH: 23.9 pg — AB (ref 26.0–34.0)
MCHC: 31.5 g/dL — ABNORMAL LOW (ref 32.0–36.0)
MCV: 75.8 fL — AB (ref 80.0–100.0)
Monocytes Absolute: 0.9 10*3/uL (ref 0.2–1.0)
Monocytes Relative: 8 %
NEUTROS PCT: 81 %
Neutro Abs: 10.3 10*3/uL — ABNORMAL HIGH (ref 1.4–6.5)
Platelets: 606 10*3/uL — ABNORMAL HIGH (ref 150–440)
RBC: 3.49 MIL/uL — AB (ref 4.40–5.90)
RDW: 18.8 % — ABNORMAL HIGH (ref 11.5–14.5)
WBC: 12.6 10*3/uL — AB (ref 3.8–10.6)

## 2017-01-13 LAB — COMPREHENSIVE METABOLIC PANEL
ALBUMIN: 2.6 g/dL — AB (ref 3.5–5.0)
ALK PHOS: 68 U/L (ref 38–126)
ALT: 10 U/L — ABNORMAL LOW (ref 17–63)
ANION GAP: 6 (ref 5–15)
AST: 19 U/L (ref 15–41)
BILIRUBIN TOTAL: 0.3 mg/dL (ref 0.3–1.2)
BUN: 25 mg/dL — ABNORMAL HIGH (ref 6–20)
CALCIUM: 10.4 mg/dL — AB (ref 8.9–10.3)
CO2: 34 mmol/L — AB (ref 22–32)
Chloride: 93 mmol/L — ABNORMAL LOW (ref 101–111)
Creatinine, Ser: 0.88 mg/dL (ref 0.61–1.24)
GFR calc non Af Amer: >60 mL/min (ref >60)
GLUCOSE: 36 mg/dL — AB (ref 65–99)
Potassium: 4.6 mmol/L (ref 3.5–5.1)
SODIUM: 133 mmol/L — AB (ref 135–145)
Total Protein: 7.4 g/dL (ref 6.5–8.1)

## 2017-01-13 MED ORDER — SODIUM CHLORIDE 0.9 % IV SOLN
45.0000 mg/m2 | Freq: Once | INTRAVENOUS | Status: AC
  Administered 2017-01-13: 84 mg via INTRAVENOUS
  Filled 2017-01-13: qty 14

## 2017-01-13 MED ORDER — PALONOSETRON HCL INJECTION 0.25 MG/5ML
0.2500 mg | Freq: Once | INTRAVENOUS | Status: AC
  Administered 2017-01-13: 0.25 mg via INTRAVENOUS
  Filled 2017-01-13: qty 5

## 2017-01-13 MED ORDER — FAMOTIDINE IN NACL 20-0.9 MG/50ML-% IV SOLN
20.0000 mg | Freq: Once | INTRAVENOUS | Status: AC
  Administered 2017-01-13: 20 mg via INTRAVENOUS
  Filled 2017-01-13: qty 50

## 2017-01-13 MED ORDER — DIPHENHYDRAMINE HCL 50 MG/ML IJ SOLN
25.0000 mg | Freq: Once | INTRAMUSCULAR | Status: AC
  Administered 2017-01-13: 25 mg via INTRAVENOUS
  Filled 2017-01-13: qty 1

## 2017-01-13 MED ORDER — SODIUM CHLORIDE 0.9 % IV SOLN
Freq: Once | INTRAVENOUS | Status: AC
  Administered 2017-01-13: 10:00:00 via INTRAVENOUS
  Filled 2017-01-13: qty 1000

## 2017-01-13 MED ORDER — HEPARIN SOD (PORK) LOCK FLUSH 100 UNIT/ML IV SOLN
500.0000 [IU] | Freq: Once | INTRAVENOUS | Status: AC | PRN
  Administered 2017-01-13: 500 [IU]
  Filled 2017-01-13: qty 5

## 2017-01-13 MED ORDER — SODIUM CHLORIDE 0.9 % IV SOLN
170.0000 mg | Freq: Once | INTRAVENOUS | Status: AC
  Administered 2017-01-13: 170 mg via INTRAVENOUS
  Filled 2017-01-13: qty 17

## 2017-01-13 MED ORDER — SODIUM CHLORIDE 0.9 % IV SOLN: 10.0000 mg | Freq: Once | INTRAVENOUS | Status: DC

## 2017-01-13 MED ORDER — DEXAMETHASONE SODIUM PHOSPHATE 10 MG/ML IJ SOLN
10.0000 mg | Freq: Once | INTRAMUSCULAR | Status: AC
  Administered 2017-01-13: 10 mg via INTRAVENOUS
  Filled 2017-01-13: qty 1

## 2017-01-13 NOTE — Progress Notes (Signed)
Pt weak, no appetite. Has had fever off on aon with max 100.  Today 99.5. Difficulty swallowing in the last 2 days. He grimaced when he drank the juice this am.

## 2017-01-14 ENCOUNTER — Ambulatory Visit
Admission: RE | Admit: 2017-01-14 | Discharge: 2017-01-14 | Disposition: A | Discharge status: Home / Self Care | Payer: Medicare Other | Source: Ambulatory Visit | Attending: Radiation Oncology | Admitting: Radiation Oncology

## 2017-01-14 DIAGNOSIS — C3411 Malignant neoplasm of upper lobe, right bronchus or lung: Secondary | ICD-10-CM | POA: Diagnosis not present

## 2017-01-15 ENCOUNTER — Other Ambulatory Visit: Payer: Self-pay | Admitting: *Deleted

## 2017-01-15 ENCOUNTER — Ambulatory Visit
Admission: RE | Admit: 2017-01-15 | Discharge: 2017-01-15 | Disposition: A | Discharge status: Home / Self Care | Payer: Medicare Other | Source: Ambulatory Visit | Attending: Radiation Oncology | Admitting: Radiation Oncology

## 2017-01-15 DIAGNOSIS — C3411 Malignant neoplasm of upper lobe, right bronchus or lung: Secondary | ICD-10-CM | POA: Diagnosis not present

## 2017-01-15 MED ORDER — OXYCODONE-ACETAMINOPHEN 5-325 MG PO TABS: 1.0000 | ORAL_TABLET | ORAL | 0 refills | Status: AC | PRN

## 2017-01-18 ENCOUNTER — Ambulatory Visit
Admission: RE | Admit: 2017-01-18 | Discharge: 2017-01-18 | Disposition: A | Discharge status: Home / Self Care | Payer: Medicare Other | Source: Ambulatory Visit | Attending: Radiation Oncology | Admitting: Radiation Oncology

## 2017-01-18 DIAGNOSIS — C3411 Malignant neoplasm of upper lobe, right bronchus or lung: Secondary | ICD-10-CM | POA: Diagnosis not present

## 2017-01-19 ENCOUNTER — Ambulatory Visit
Admission: RE | Admit: 2017-01-19 | Discharge: 2017-01-19 | Disposition: A | Discharge status: Home / Self Care | Payer: Medicare Other | Source: Ambulatory Visit | Attending: Radiation Oncology | Admitting: Radiation Oncology

## 2017-01-19 DIAGNOSIS — C3411 Malignant neoplasm of upper lobe, right bronchus or lung: Secondary | ICD-10-CM | POA: Diagnosis not present

## 2017-01-19 NOTE — Progress Notes (Signed)
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  INTERVAL HISTORY: Patient returns to clinic today for further evaluation and consideration of cycle 3 of weekly carboplatinum and Taxol. He is tolerating his daily XRT. He has a decreased appetite. His pain is better controlled. He has no neurologic complaints. He denies any recent fevers or illnesses. He denies any shortness of breath, cough, or hemoptysis. He denies any nausea, vomiting, constipation, or diarrhea. He has no melena or hematochezia. He has no urinary complaints. Patient otherwise feels well and offers no further specific complaints.  REVIEW OF SYSTEMS:   Review of Systems  Constitutional: Negative for fever and weight loss.  Respiratory: Negative.  Negative for cough and shortness of breath.   Cardiovascular: Positive for chest pain. Negative for leg swelling.  Gastrointestinal: Negative.  Negative for abdominal pain.  Genitourinary: Positive for flank pain.  Neurological: Positive for weakness. Negative for sensory change.  Psychiatric/Behavioral: The patient is nervous/anxious.     As per HPI. Otherwise, a complete review of systems is negative.  PAST MEDICAL HISTORY: Past Medical History:  Diagnosis Date  . Arthritis   . Basal cell carcinoma of skin   . Bladder cancer ([place])   . COPD (chronic obstructive pulmonary disease) ([place])   . Diabetes mellitus without complication ([place])   . Dyspnea    with exertion  . History of kidney stones   . Hyperlipidemia   . Hypertension   . Kidney stone   . Pneumonia   . Sleep apnea   . Squamous cell skin cancer     PAST SURGICAL HISTORY: Past Surgical History:  Procedure Laterality Date  . BLADDER FULGURATION     . HERNIA REPAIR    . PORTA CATH INSERTION N/A 12/28/2016   Procedure: Glori Luis Cath Insertion;  Surgeon: Algernon Huxley, MD;  Location: [facility];  Service: Cardiovascular;  Laterality: N/A;    FAMILY HISTORY: Family History  Problem Relation Age of Onset  . Breast cancer Mother   . Stroke Father   . Diabetes Father   . Prostate cancer Neg Hx   . Bladder Cancer Neg Hx   . Kidney cancer Neg Hx     ADVANCED DIRECTIVES (Y/N):  N  HEALTH MAINTENANCE: Social History  Substance Use Topics  . Smoking status: Former Smoker    Years: 60.00    Quit date: 12/14/2013  . Smokeless tobacco: Current User    Types: Snuff  . Alcohol use No     Colonoscopy:  PAP:  Bone density:  Lipid panel:  No Known Allergies  Current Outpatient Prescriptions  Medication Sig Dispense Refill  . acetaminophen (TYLENOL) 500 MG tablet Take 500 mg by mouth every 6 (six) hours as needed for moderate pain or headache.    . albuterol (PROVENTIL HFA;VENTOLIN HFA) 108 (90 Base) MCG/ACT inhaler Inhale 1-2 puffs into the lungs every 6 (six) hours as needed for wheezing or shortness of breath.     Marland Kitchen albuterol (PROVENTIL) (2.5 MG/3ML) 0.083% nebulizer solution Take 2.5 mg by nebulization every 4 (four) hours as needed for wheezing or shortness of breath.     Marland Kitchen aspirin 81 MG chewable tablet TAKE 1 TABLET (81 MG) BY MOUTH AT NIGHT    . Cholecalciferol (VITAMIN  D) 2000 units CAPS Take 2,000 Units by mouth daily.    . colchicine 0.6 MG tablet Take 0.6 mg by mouth daily.    . diphenhydramine-acetaminophen (TYLENOL PM) 25-500 MG TABS tablet Take 1-2 tablets by mouth at bedtime as needed (sleep).    Marland Kitchen glimepiride (AMARYL) 2 MG tablet Take 2 mg by mouth daily.     Marland Kitchen lidocaine-prilocaine (EMLA) cream Apply to affected area once 30 g 3  . lisinopril (PRINIVIL,ZESTRIL) 40 MG tablet Take 40 mg by mouth daily.    Marland Kitchen oxyCODONE-acetaminophen (PERCOCET/ROXICET) 5-325 MG tablet Take 1 tablet by mouth every 4 (four) hours as  needed for severe pain. 90 tablet 0  . OXYGEN Inhale 2 L into the lungs continuous.    . prochlorperazine (COMPAZINE) 10 MG tablet Take 1 tablet (10 mg total) by mouth every 6 (six) hours as needed (Nausea or vomiting). 60 tablet 2  . simvastatin (ZOCOR) 40 MG tablet Take 40 mg by mouth daily.     . SYMBICORT 160-4.5 MCG/ACT inhaler Inhale 2 puffs into the lungs 2 (two) times daily.    . tamsulosin (FLOMAX) 0.4 MG CAPS capsule TAKE 1 TABLET BY MOUTH EVERY NIGHT BEFORE BEDTIME    . tiotropium (SPIRIVA HANDIHALER) 18 MCG inhalation capsule Place 18 mcg into inhaler and inhale at bedtime.     . fentaNYL (DURAGESIC - DOSED MCG/HR) 25 MCG/HR patch Place 1 patch (25 mcg total) onto the skin every 3 (three) days. (Patient not taking: Reported on 01/13/2017) 10 patch 0  . metFORMIN (GLUCOPHAGE-XR) 500 MG 24 hr tablet Take 1,000 mg by mouth 2 (two) times daily.     . Omega-3 Fatty Acids (FISH OIL) 1200 MG CAPS Take 1,200 mg by mouth daily.    . ondansetron (ZOFRAN) 8 MG tablet Take 1 tablet (8 mg total) by mouth 2 (two) times daily as needed for refractory nausea / vomiting. 60 tablet 2   No current facility-administered medications for this visit.    Facility-Administered Medications Ordered in Other Visits  Medication Dose Route Frequency Provider Last Rate Last Dose  . sodium chloride flush (NS) 0.9 % injection 10 mL  10 mL Intravenous PRN Lloyd Huger, MD   10 mL at 01/06/17 0830    OBJECTIVE: Vitals:   01/20/17 1004  BP: (!) 75/46  Pulse: 84  Resp: 18  Temp: 98.1 F (36.7 C)     Body mass index is 24.96 kg/m.    ECOG FS:2 - Symptomatic, <50% confined to bed  General: Well-developed, well-nourished, no acute distress. Eyes: Pink conjunctiva, anicteric sclera. Lungs: Clear to auscultation bilaterally. Heart: Regular rate and rhythm. No rubs, murmurs, or gallops. Abdomen: Soft, nontender, nondistended. No organomegaly noted, normoactive bowel sounds. Musculoskeletal: No edema,  cyanosis, or clubbing. Neuro: Alert, answering all questions appropriately. Cranial nerves grossly intact. Skin: No rashes or petechiae noted. Psych: Normal affect.   LAB RESULTS:  Lab Results  Component Value Date   NA 133 (L) 01/20/2017   K 4.3 01/20/2017   CL 95 (L) 01/20/2017   CO2 31 01/20/2017   GLUCOSE 150 (H) 01/20/2017   BUN 25 (H) 01/20/2017   CREATININE 0.95 01/20/2017   CALCIUM 9.7 01/20/2017   PROT 7.3 01/20/2017   ALBUMIN 2.6 (L) 01/20/2017   AST 15 01/20/2017   ALT 13 (L) 01/20/2017   ALKPHOS 77 01/20/2017   BILITOT 0.3 01/20/2017   GFRNONAA >60 01/20/2017   GFRAA >60 01/20/2017    Lab Results  Component Value Date  WBC 7.2 01/20/2017   NEUTROABS 5.3 01/20/2017   HGB 7.9 (L) 01/20/2017   HCT 24.9 (L) 01/20/2017   MCV 76.9 (L) 01/20/2017   PLT 534 (H) 01/20/2017     STUDIES: Mr Jeri Cos ZO Contrast  Result Date: 12/31/2016 CLINICAL DATA:  New diagnosis with lung cancer. Balance disturbance over the last month. Staging. EXAM: MRI HEAD WITHOUT AND WITH CONTRAST TECHNIQUE: Multiplanar, multiecho pulse sequences of the brain and surrounding structures were obtained without and with intravenous contrast. CONTRAST:  73m MULTIHANCE GADOBENATE DIMEGLUMINE 529 MG/ML IV SOLN COMPARISON:  None. FINDINGS: Brain: Brainstem shows chronic small-vessel ischemic changes. No focal cerebellar insult. Within the cerebral hemispheres, there is a subcentimeter focus of restricted diffusion in the deep white matter adjacent to the posterior body of the left lateral ventricle most consistent with a subacute infarction. There are mild chronic small-vessel ischemic changes elsewhere throughout the hemispheric white matter. After contrast administration, there is minimal linear enhancement in this location, most typical of subacute infarction. This does not look like a rounded mass typical of a metastasis. There is no vasogenic edema. However, there are other small foci of contrast  enhancement seen within both cerebral hemispheres that raise concern for small metastases. The differential diagnosis does include other small micro embolic infarctions which are older, but there is no restricted diffusion and I favor, particularly given the primary chest lesion in this case, that we are dealing with early cerebral metastatic disease. Lesions are as follows. Axial image 32.  6 mm focus right frontal subcortical white matter. Axial image 40.  6 mm focus medial left parietal lobe. Axial image 39.  2 mm focus right posterior frontal cortex. Axial image 37.  4 mm focus left frontal cortex. No hydrocephalus. No extra-axial collection. No skull or skullbase lesion identified. Vascular: Major vessels at the base of the brain show flow. Skull and upper cervical spine: Negative Sinuses/Orbits: Clear/normal Other: None significant IMPRESSION: Indeterminate study, but with suspicion of small/early metastatic disease to the brain. Subcentimeter region of restricted diffusion in the deep white matter adjacent to the posterior body of the left lateral ventricle that is most consistent with a subacute white matter infarction. The patient does have small-vessel changes elsewhere throughout the brain. Other scattered foci as listed above which could either be older subacute infarctions which no longer show restricted diffusion or could represent small metastatic lesions. Small metastatic lesions are favored for those foci. I realize this presents a treatment and diagnostic dilemma at this point, but my presumption is that these are small metastases. Repeat scanning could be done in 4 weeks to assess for any change. None of the lesions are associated with vasogenic edema or mass effect. Electronically Signed   By: MNelson ChimesM.D.   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  PLAN:    1. Stage IVa squamous cell carcinoma of the right  upper lobe lung: Patient noted to have metastatic disease in bilateral lungs as well as on T1 vertebrae. MRI the brain reviewed independently and reported as above with multiple scattered lesions too small to determine whether they are metastatic disease or not. Will repeat MRI in 2-3 months. Continue daily XRT. Proceed with cycle 3 of weekly carboplatinum and Taxol. Return to clinic in 1 week for consideration of cycle 4 and then in 2 weeks for further evaluation and consideration of cycle 5. 2. Flank pain: Likely secondary to  lateral chest wall invasion of lung mass. Improving. Patient has been instructed to retry his fentanyl patch and continue oxycodone as needed. Continue XRT as above.  3. Bladder cancer: Patient known to have high-grade noninvasive urothelial carcinoma. His treatment with carboplatinum may be of some benefit, but in the future can consider a chemotherapeutic regimen for his lung cancer that would also benefit bladder cancer such as cisplatin and gemcitabine.  4. Poor appetite: Consider dietary referral.  Patient expressed understanding and was in agreement with this plan. He also understands that He can call clinic at any time with any questions, concerns, or complaints.   Cancer Staging Primary lung squamous cell carcinoma, right [facility]) Staging form: Lung, AJCC 8th Edition - Clinical stage from 12/23/2016: Stage IVA (cT3, cN3, pM1a) - Signed by Lloyd Huger, MD on 12/23/2016   Lloyd Huger, MD   01/24/2017 9:20 AM

## 2017-01-20 ENCOUNTER — Inpatient Hospital Stay: Payer: Medicare Other

## 2017-01-20 ENCOUNTER — Inpatient Hospital Stay: Payer: Medicare Other | Attending: Oncology

## 2017-01-20 ENCOUNTER — Inpatient Hospital Stay (HOSPITAL_BASED_OUTPATIENT_CLINIC_OR_DEPARTMENT_OTHER): Payer: Medicare Other | Admitting: Oncology

## 2017-01-20 ENCOUNTER — Ambulatory Visit
Admission: RE | Admit: 2017-01-20 | Discharge: 2017-01-20 | Disposition: A | Discharge status: Home / Self Care | Payer: Medicare Other | Source: Ambulatory Visit | Attending: Radiation Oncology | Admitting: Radiation Oncology

## 2017-01-20 VITALS — BP 75/46 | HR 84 | Temp 98.1°F | Resp 18 | Wt 150.0 lb

## 2017-01-20 VITALS — BP 117/66 | HR 83 | Resp 18

## 2017-01-20 DIAGNOSIS — Z923 Personal history of irradiation: Secondary | ICD-10-CM | POA: Insufficient documentation

## 2017-01-20 DIAGNOSIS — E785 Hyperlipidemia, unspecified: Secondary | ICD-10-CM

## 2017-01-20 DIAGNOSIS — Z7982 Long term (current) use of aspirin: Secondary | ICD-10-CM

## 2017-01-20 DIAGNOSIS — C3491 Malignant neoplasm of unspecified part of right bronchus or lung: Secondary | ICD-10-CM

## 2017-01-20 DIAGNOSIS — R5383 Other fatigue: Secondary | ICD-10-CM | POA: Insufficient documentation

## 2017-01-20 DIAGNOSIS — R0602 Shortness of breath: Secondary | ICD-10-CM | POA: Insufficient documentation

## 2017-01-20 DIAGNOSIS — G4730 Sleep apnea, unspecified: Secondary | ICD-10-CM | POA: Insufficient documentation

## 2017-01-20 DIAGNOSIS — C7800 Secondary malignant neoplasm of unspecified lung: Secondary | ICD-10-CM | POA: Insufficient documentation

## 2017-01-20 DIAGNOSIS — R630 Anorexia: Secondary | ICD-10-CM | POA: Diagnosis not present

## 2017-01-20 DIAGNOSIS — Z5111 Encounter for antineoplastic chemotherapy: Secondary | ICD-10-CM | POA: Diagnosis not present

## 2017-01-20 DIAGNOSIS — Z87891 Personal history of nicotine dependence: Secondary | ICD-10-CM

## 2017-01-20 DIAGNOSIS — Z85828 Personal history of other malignant neoplasm of skin: Secondary | ICD-10-CM | POA: Diagnosis not present

## 2017-01-20 DIAGNOSIS — C3411 Malignant neoplasm of upper lobe, right bronchus or lung: Secondary | ICD-10-CM | POA: Diagnosis not present

## 2017-01-20 DIAGNOSIS — C679 Malignant neoplasm of bladder, unspecified: Secondary | ICD-10-CM | POA: Insufficient documentation

## 2017-01-20 DIAGNOSIS — Z79899 Other long term (current) drug therapy: Secondary | ICD-10-CM | POA: Insufficient documentation

## 2017-01-20 DIAGNOSIS — M129 Arthropathy, unspecified: Secondary | ICD-10-CM | POA: Insufficient documentation

## 2017-01-20 DIAGNOSIS — Z8701 Personal history of pneumonia (recurrent): Secondary | ICD-10-CM

## 2017-01-20 DIAGNOSIS — R079 Chest pain, unspecified: Secondary | ICD-10-CM

## 2017-01-20 DIAGNOSIS — J449 Chronic obstructive pulmonary disease, unspecified: Secondary | ICD-10-CM

## 2017-01-20 DIAGNOSIS — Z9221 Personal history of antineoplastic chemotherapy: Secondary | ICD-10-CM

## 2017-01-20 DIAGNOSIS — C7951 Secondary malignant neoplasm of bone: Secondary | ICD-10-CM | POA: Insufficient documentation

## 2017-01-20 DIAGNOSIS — G939 Disorder of brain, unspecified: Secondary | ICD-10-CM | POA: Diagnosis not present

## 2017-01-20 DIAGNOSIS — Z7984 Long term (current) use of oral hypoglycemic drugs: Secondary | ICD-10-CM | POA: Diagnosis not present

## 2017-01-20 DIAGNOSIS — Z87442 Personal history of urinary calculi: Secondary | ICD-10-CM | POA: Diagnosis not present

## 2017-01-20 DIAGNOSIS — E119 Type 2 diabetes mellitus without complications: Secondary | ICD-10-CM | POA: Insufficient documentation

## 2017-01-20 DIAGNOSIS — I10 Essential (primary) hypertension: Secondary | ICD-10-CM | POA: Diagnosis not present

## 2017-01-20 DIAGNOSIS — Z803 Family history of malignant neoplasm of breast: Secondary | ICD-10-CM | POA: Insufficient documentation

## 2017-01-20 DIAGNOSIS — R531 Weakness: Secondary | ICD-10-CM | POA: Insufficient documentation

## 2017-01-20 LAB — COMPREHENSIVE METABOLIC PANEL
ALBUMIN: 2.6 g/dL — AB (ref 3.5–5.0)
ALT: 13 U/L — AB (ref 17–63)
AST: 15 U/L (ref 15–41)
Alkaline Phosphatase: 77 U/L (ref 38–126)
Anion gap: 7 (ref 5–15)
BUN: 25 mg/dL — AB (ref 6–20)
CHLORIDE: 95 mmol/L — AB (ref 101–111)
CO2: 31 mmol/L (ref 22–32)
Calcium: 9.7 mg/dL (ref 8.9–10.3)
Creatinine, Ser: 0.95 mg/dL (ref 0.61–1.24)
GFR calc Af Amer: >60 mL/min (ref >60)
GFR calc non Af Amer: >60 mL/min (ref >60)
GLUCOSE: 150 mg/dL — AB (ref 65–99)
POTASSIUM: 4.3 mmol/L (ref 3.5–5.1)
Sodium: 133 mmol/L — ABNORMAL LOW (ref 135–145)
Total Bilirubin: 0.3 mg/dL (ref 0.3–1.2)
Total Protein: 7.3 g/dL (ref 6.5–8.1)

## 2017-01-20 LAB — CBC WITH DIFFERENTIAL/PLATELET
BASOS ABS: 0.1 10*3/uL (ref 0–0.1)
Basophils Relative: 1 %
Eosinophils Absolute: 0.1 10*3/uL (ref 0–0.7)
Eosinophils Relative: 2 %
HEMATOCRIT: 24.9 % — AB (ref 40.0–52.0)
Hemoglobin: 7.9 g/dL — ABNORMAL LOW (ref 13.0–18.0)
LYMPHS PCT: 14 %
Lymphs Abs: 1 10*3/uL (ref 1.0–3.6)
MCH: 24.3 pg — ABNORMAL LOW (ref 26.0–34.0)
MCHC: 31.6 g/dL — AB (ref 32.0–36.0)
MCV: 76.9 fL — AB (ref 80.0–100.0)
MONO ABS: 0.7 10*3/uL (ref 0.2–1.0)
MONOS PCT: 9 %
NEUTROS ABS: 5.3 10*3/uL (ref 1.4–6.5)
NEUTROS PCT: 74 %
Platelets: 534 10*3/uL — ABNORMAL HIGH (ref 150–440)
RBC: 3.24 MIL/uL — ABNORMAL LOW (ref 4.40–5.90)
RDW: 19.2 % — AB (ref 11.5–14.5)
WBC: 7.2 10*3/uL (ref 3.8–10.6)

## 2017-01-20 MED ORDER — SODIUM CHLORIDE 0.9 % IV SOLN
170.0000 mg | Freq: Once | INTRAVENOUS | Status: AC
  Administered 2017-01-20: 170 mg via INTRAVENOUS
  Filled 2017-01-20: qty 17

## 2017-01-20 MED ORDER — DEXAMETHASONE SODIUM PHOSPHATE 10 MG/ML IJ SOLN
10.0000 mg | Freq: Once | INTRAMUSCULAR | Status: AC
Start: 2017-01-20 — End: 2017-01-20
  Administered 2017-01-20: 10 mg via INTRAVENOUS
  Filled 2017-01-20: qty 1

## 2017-01-20 MED ORDER — DIPHENHYDRAMINE HCL 50 MG/ML IJ SOLN
25.0000 mg | Freq: Once | INTRAMUSCULAR | Status: AC
  Administered 2017-01-20: 25 mg via INTRAVENOUS
  Filled 2017-01-20: qty 1

## 2017-01-20 MED ORDER — SODIUM CHLORIDE 0.9 % IV SOLN: 10.0000 mg | Freq: Once | INTRAVENOUS | Status: DC

## 2017-01-20 MED ORDER — SODIUM CHLORIDE 0.9 % IV SOLN
Freq: Once | INTRAVENOUS | Status: AC
  Administered 2017-01-20: 11:00:00 via INTRAVENOUS
  Filled 2017-01-20: qty 1000

## 2017-01-20 MED ORDER — PALONOSETRON HCL INJECTION 0.25 MG/5ML
0.2500 mg | Freq: Once | INTRAVENOUS | Status: AC
  Administered 2017-01-20: 0.25 mg via INTRAVENOUS
  Filled 2017-01-20: qty 5

## 2017-01-20 MED ORDER — SODIUM CHLORIDE 0.9 % IV SOLN
45.0000 mg/m2 | Freq: Once | INTRAVENOUS | Status: AC
  Administered 2017-01-20: 84 mg via INTRAVENOUS
  Filled 2017-01-20: qty 14

## 2017-01-20 MED ORDER — FAMOTIDINE IN NACL 20-0.9 MG/50ML-% IV SOLN
20.0000 mg | Freq: Once | INTRAVENOUS | Status: AC
  Administered 2017-01-20: 20 mg via INTRAVENOUS
  Filled 2017-01-20: qty 50

## 2017-01-20 MED ORDER — HEPARIN SOD (PORK) LOCK FLUSH 100 UNIT/ML IV SOLN
500.0000 [IU] | Freq: Once | INTRAVENOUS | Status: AC | PRN
  Administered 2017-01-20: 500 [IU]
  Filled 2017-01-20: qty 5

## 2017-01-20 NOTE — Progress Notes (Signed)
hgb =7.9.  MD ok to proceed with treatment today

## 2017-01-20 NOTE — Progress Notes (Signed)
Complains of decreased appetite, right shoulder pain that is controlled with pain medication. Pt has not been wearing fentanyl patch and only taking percocet every 4 hours for pain. Informed pt that will have better pain control if wears fentanyl patch and only taking percocet for breakthrough pain. Pt stated will try to wear fentanyl patch again.

## 2017-01-20 NOTE — Progress Notes (Signed)
Dr. Grayland Ormond aware of hgb 7.9 today.  Order to continue with treatment as planned.

## 2017-01-21 ENCOUNTER — Ambulatory Visit
Admission: RE | Admit: 2017-01-21 | Discharge: 2017-01-21 | Disposition: A | Discharge status: Home / Self Care | Payer: Medicare Other | Source: Ambulatory Visit | Attending: Radiation Oncology | Admitting: Radiation Oncology

## 2017-01-21 ENCOUNTER — Inpatient Hospital Stay: Payer: Medicare Other

## 2017-01-21 DIAGNOSIS — C3411 Malignant neoplasm of upper lobe, right bronchus or lung: Secondary | ICD-10-CM | POA: Diagnosis not present

## 2017-01-22 ENCOUNTER — Ambulatory Visit
Admission: RE | Admit: 2017-01-22 | Discharge: 2017-01-22 | Disposition: A | Discharge status: Home / Self Care | Payer: Medicare Other | Source: Ambulatory Visit | Attending: Radiation Oncology | Admitting: Radiation Oncology

## 2017-01-22 DIAGNOSIS — C3411 Malignant neoplasm of upper lobe, right bronchus or lung: Secondary | ICD-10-CM | POA: Diagnosis not present

## 2017-01-25 ENCOUNTER — Ambulatory Visit
Admission: RE | Admit: 2017-01-25 | Discharge: 2017-01-25 | Disposition: A | Discharge status: Home / Self Care | Payer: Medicare Other | Source: Ambulatory Visit | Attending: Radiation Oncology | Admitting: Radiation Oncology

## 2017-01-25 DIAGNOSIS — C3411 Malignant neoplasm of upper lobe, right bronchus or lung: Secondary | ICD-10-CM | POA: Diagnosis not present

## 2017-01-26 ENCOUNTER — Ambulatory Visit
Admission: RE | Admit: 2017-01-26 | Discharge: 2017-01-26 | Disposition: A | Discharge status: Home / Self Care | Payer: Medicare Other | Source: Ambulatory Visit | Attending: Radiation Oncology | Admitting: Radiation Oncology

## 2017-01-26 ENCOUNTER — Other Ambulatory Visit: Payer: Self-pay | Admitting: *Deleted

## 2017-01-26 DIAGNOSIS — C3411 Malignant neoplasm of upper lobe, right bronchus or lung: Secondary | ICD-10-CM | POA: Diagnosis not present

## 2017-01-26 MED ORDER — DEXAMETHASONE 4 MG PO TABS: 4.0000 mg | ORAL_TABLET | Freq: Every day | ORAL | 0 refills | Status: DC

## 2017-01-27 ENCOUNTER — Inpatient Hospital Stay: Payer: Medicare Other

## 2017-01-27 ENCOUNTER — Ambulatory Visit
Admission: RE | Admit: 2017-01-27 | Discharge: 2017-01-27 | Disposition: A | Discharge status: Home / Self Care | Payer: Medicare Other | Source: Ambulatory Visit | Attending: Radiation Oncology | Admitting: Radiation Oncology

## 2017-01-27 VITALS — BP 124/76 | HR 77 | Temp 97.8°F | Resp 18

## 2017-01-27 DIAGNOSIS — C3411 Malignant neoplasm of upper lobe, right bronchus or lung: Secondary | ICD-10-CM | POA: Diagnosis not present

## 2017-01-27 DIAGNOSIS — C3491 Malignant neoplasm of unspecified part of right bronchus or lung: Secondary | ICD-10-CM

## 2017-01-27 LAB — COMPREHENSIVE METABOLIC PANEL
ALBUMIN: 2.8 g/dL — AB (ref 3.5–5.0)
ALT: 12 U/L — AB (ref 17–63)
AST: 17 U/L (ref 15–41)
Alkaline Phosphatase: 75 U/L (ref 38–126)
Anion gap: 8 (ref 5–15)
BILIRUBIN TOTAL: 0.3 mg/dL (ref 0.3–1.2)
BUN: 23 mg/dL — ABNORMAL HIGH (ref 6–20)
CHLORIDE: 95 mmol/L — AB (ref 101–111)
CO2: 29 mmol/L (ref 22–32)
Calcium: 9.6 mg/dL (ref 8.9–10.3)
Creatinine, Ser: 0.9 mg/dL (ref 0.61–1.24)
GFR calc Af Amer: >60 mL/min (ref >60)
GFR calc non Af Amer: >60 mL/min (ref >60)
GLUCOSE: 176 mg/dL — AB (ref 65–99)
POTASSIUM: 4.8 mmol/L (ref 3.5–5.1)
Sodium: 132 mmol/L — ABNORMAL LOW (ref 135–145)
Total Protein: 7.3 g/dL (ref 6.5–8.1)

## 2017-01-27 LAB — CBC WITH DIFFERENTIAL/PLATELET
BASOS ABS: 0 10*3/uL (ref 0–0.1)
BASOS PCT: 1 %
EOS PCT: 0 %
Eosinophils Absolute: 0 10*3/uL (ref 0–0.7)
HCT: 25 % — ABNORMAL LOW (ref 40.0–52.0)
Hemoglobin: 8 g/dL — ABNORMAL LOW (ref 13.0–18.0)
LYMPHS PCT: 7 %
Lymphs Abs: 0.4 10*3/uL — ABNORMAL LOW (ref 1.0–3.6)
MCH: 24.6 pg — ABNORMAL LOW (ref 26.0–34.0)
MCHC: 32.1 g/dL (ref 32.0–36.0)
MCV: 76.8 fL — AB (ref 80.0–100.0)
MONO ABS: 0.1 10*3/uL — AB (ref 0.2–1.0)
MONOS PCT: 2 %
Neutro Abs: 5.3 10*3/uL (ref 1.4–6.5)
Neutrophils Relative %: 90 %
PLATELETS: 443 10*3/uL — AB (ref 150–440)
RBC: 3.25 MIL/uL — ABNORMAL LOW (ref 4.40–5.90)
RDW: 19.8 % — AB (ref 11.5–14.5)
WBC: 5.9 10*3/uL (ref 3.8–10.6)

## 2017-01-27 LAB — SAMPLE TO BLOOD BANK

## 2017-01-27 MED ORDER — PACLITAXEL CHEMO INJECTION 300 MG/50ML
45.0000 mg/m2 | Freq: Once | INTRAVENOUS | Status: AC
  Administered 2017-01-27: 84 mg via INTRAVENOUS
  Filled 2017-01-27: qty 14

## 2017-01-27 MED ORDER — SODIUM CHLORIDE 0.9% FLUSH
10.0000 mL | Freq: Once | INTRAVENOUS | Status: AC
  Administered 2017-01-27: 10 mL via INTRAVENOUS
  Filled 2017-01-27: qty 10

## 2017-01-27 MED ORDER — DIPHENHYDRAMINE HCL 50 MG/ML IJ SOLN
25.0000 mg | Freq: Once | INTRAMUSCULAR | Status: AC
  Administered 2017-01-27: 25 mg via INTRAVENOUS
  Filled 2017-01-27: qty 1

## 2017-01-27 MED ORDER — SODIUM CHLORIDE 0.9 % IV SOLN
170.0000 mg | Freq: Once | INTRAVENOUS | Status: AC
  Administered 2017-01-27: 170 mg via INTRAVENOUS
  Filled 2017-01-27: qty 17

## 2017-01-27 MED ORDER — SODIUM CHLORIDE 0.9 % IV SOLN: 10.0000 mg | Freq: Once | INTRAVENOUS | Status: DC

## 2017-01-27 MED ORDER — FAMOTIDINE IN NACL 20-0.9 MG/50ML-% IV SOLN
20.0000 mg | Freq: Once | INTRAVENOUS | Status: AC
  Administered 2017-01-27: 20 mg via INTRAVENOUS
  Filled 2017-01-27: qty 50

## 2017-01-27 MED ORDER — DEXAMETHASONE SODIUM PHOSPHATE 10 MG/ML IJ SOLN
10.0000 mg | Freq: Once | INTRAMUSCULAR | Status: AC
  Administered 2017-01-27: 10 mg via INTRAVENOUS
  Filled 2017-01-27: qty 1

## 2017-01-27 MED ORDER — HEPARIN SOD (PORK) LOCK FLUSH 100 UNIT/ML IV SOLN
500.0000 [IU] | Freq: Once | INTRAVENOUS | Status: AC
Start: 2017-01-27 — End: 2017-01-27
  Administered 2017-01-27: 500 [IU] via INTRAVENOUS

## 2017-01-27 MED ORDER — PALONOSETRON HCL INJECTION 0.25 MG/5ML
0.2500 mg | Freq: Once | INTRAVENOUS | Status: AC
  Administered 2017-01-27: 0.25 mg via INTRAVENOUS
  Filled 2017-01-27: qty 5

## 2017-01-27 MED ORDER — SODIUM CHLORIDE 0.9 % IV SOLN
Freq: Once | INTRAVENOUS | Status: AC
  Administered 2017-01-27: 11:00:00 via INTRAVENOUS
  Filled 2017-01-27: qty 1000

## 2017-01-27 MED ORDER — HEPARIN SOD (PORK) LOCK FLUSH 100 UNIT/ML IV SOLN
500.0000 [IU] | Freq: Once | INTRAVENOUS | Status: AC | PRN
  Administered 2017-01-27: 500 [IU]
  Filled 2017-01-27: qty 5

## 2017-01-28 ENCOUNTER — Ambulatory Visit
Admission: RE | Admit: 2017-01-28 | Discharge: 2017-01-28 | Disposition: A | Discharge status: Home / Self Care | Payer: Medicare Other | Source: Ambulatory Visit | Attending: Radiation Oncology | Admitting: Radiation Oncology

## 2017-01-28 DIAGNOSIS — C3411 Malignant neoplasm of upper lobe, right bronchus or lung: Secondary | ICD-10-CM | POA: Diagnosis not present

## 2017-01-29 ENCOUNTER — Ambulatory Visit
Admission: RE | Admit: 2017-01-29 | Discharge: 2017-01-29 | Disposition: A | Discharge status: Home / Self Care | Payer: Medicare Other | Source: Ambulatory Visit | Attending: Radiation Oncology | Admitting: Radiation Oncology

## 2017-01-29 DIAGNOSIS — C3411 Malignant neoplasm of upper lobe, right bronchus or lung: Secondary | ICD-10-CM | POA: Diagnosis not present

## 2017-02-01 ENCOUNTER — Ambulatory Visit
Admission: RE | Admit: 2017-02-01 | Discharge: 2017-02-01 | Disposition: A | Discharge status: Home / Self Care | Payer: Medicare Other | Source: Ambulatory Visit | Attending: Radiation Oncology | Admitting: Radiation Oncology

## 2017-02-01 DIAGNOSIS — C3411 Malignant neoplasm of upper lobe, right bronchus or lung: Secondary | ICD-10-CM | POA: Diagnosis not present

## 2017-02-02 ENCOUNTER — Ambulatory Visit
Admission: RE | Admit: 2017-02-02 | Discharge: 2017-02-02 | Disposition: A | Discharge status: Home / Self Care | Payer: Medicare Other | Source: Ambulatory Visit | Attending: Radiation Oncology | Admitting: Radiation Oncology

## 2017-02-02 DIAGNOSIS — C3411 Malignant neoplasm of upper lobe, right bronchus or lung: Secondary | ICD-10-CM | POA: Diagnosis not present

## 2017-02-02 NOTE — Progress Notes (Signed)
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  INTERVAL HISTORY: Patient returns to clinic today for further evaluation and consideration of cycle 5 of weekly carboplatinum and Taxol. He is tolerating his daily XRT, although admits to increased weakness and fatigue. He has a decreased appetite. His pain is better controlled. He has no neurologic complaints. He denies any recent fevers or illnesses. He denies any shortness of breath, cough, or hemoptysis. He denies any nausea, vomiting, constipation, or diarrhea. He has no melena or hematochezia. He has no urinary complaints. Patient otherwise feels well and offers no further specific complaints.  REVIEW OF SYSTEMS:   Review of Systems  Constitutional: Positive for malaise/fatigue. Negative for fever and weight loss.  Respiratory: Negative.  Negative for cough and shortness of breath.   Cardiovascular: Negative for chest pain and leg swelling.  Gastrointestinal: Negative.  Negative for abdominal pain.  Genitourinary: Negative.  Negative for flank pain.  Musculoskeletal: Negative.   Neurological: Positive for weakness. Negative for sensory change.  Psychiatric/Behavioral: The patient is nervous/anxious.     As per HPI. Otherwise, a complete review of systems is negative.  PAST MEDICAL HISTORY: Past Medical History:  Diagnosis Date  . Arthritis   . Basal cell carcinoma of skin   . Bladder cancer ([place])   . COPD (chronic obstructive pulmonary disease) ([place])   . Diabetes mellitus without complication ([place])   . Dyspnea    with exertion  . History of kidney stones   . Hyperlipidemia   . Hypertension   . Kidney stone   . Pneumonia   . Sleep apnea   . Squamous cell skin  cancer     PAST SURGICAL HISTORY: Past Surgical History:  Procedure Laterality Date  . BLADDER FULGURATION    . HERNIA REPAIR    . PORTA CATH INSERTION N/A 12/28/2016   Procedure: Glori Luis Cath Insertion;  Surgeon: Algernon Huxley, MD;  Location: [facility];  Service: Cardiovascular;  Laterality: N/A;    FAMILY HISTORY: Family History  Problem Relation Age of Onset  . Breast cancer Mother   . Stroke Father   . Diabetes Father   . Prostate cancer Neg Hx   . Bladder Cancer Neg Hx   . Kidney cancer Neg Hx     ADVANCED DIRECTIVES (Y/N):  N  HEALTH MAINTENANCE: Social History  Substance Use Topics  . Smoking status: Former Smoker    Years: 60.00    Quit date: 12/14/2013  . Smokeless tobacco: Current User    Types: Snuff  . Alcohol use No     Colonoscopy:  PAP:  Bone density:  Lipid panel:  No Known Allergies  Current Outpatient Prescriptions  Medication Sig Dispense Refill  . acetaminophen (TYLENOL) 500 MG tablet Take 500 mg by mouth every 6 (six) hours as needed for moderate pain or headache.    . albuterol (PROVENTIL HFA;VENTOLIN HFA) 108 (90 Base) MCG/ACT inhaler Inhale 1-2 puffs into the lungs every 6 (six) hours as needed for wheezing or shortness of breath.     Marland Kitchen albuterol (PROVENTIL) (2.5 MG/3ML) 0.083% nebulizer solution Take 2.5 mg by nebulization every 4 (four) hours as needed for wheezing or shortness of breath.     Marland Kitchen aspirin 81 MG chewable tablet  TAKE 1 TABLET (81 MG) BY MOUTH AT NIGHT    . Cholecalciferol (VITAMIN D) 2000 units CAPS Take 2,000 Units by mouth daily.    . colchicine 0.6 MG tablet Take 0.6 mg by mouth daily.    Marland Kitchen dexamethasone (DECADRON) 4 MG tablet Take 1 tablet (4 mg total) by mouth daily. 25 tablet 0  . diphenhydramine-acetaminophen (TYLENOL PM) 25-500 MG TABS tablet Take 1-2 tablets by mouth at bedtime as needed (sleep).    Marland Kitchen glimepiride (AMARYL) 2 MG tablet Take 2 mg by mouth daily.     Marland Kitchen lidocaine-prilocaine (EMLA) cream Apply to  affected area once 30 g 3  . lisinopril (PRINIVIL,ZESTRIL) 40 MG tablet Take 40 mg by mouth daily.    . metFORMIN (GLUCOPHAGE-XR) 500 MG 24 hr tablet Take 1,000 mg by mouth 2 (two) times daily.     . Omega-3 Fatty Acids (FISH OIL) 1200 MG CAPS Take 1,200 mg by mouth daily.    . ondansetron (ZOFRAN) 8 MG tablet Take 1 tablet (8 mg total) by mouth 2 (two) times daily as needed for refractory nausea / vomiting. 60 tablet 2  . oxyCODONE-acetaminophen (PERCOCET/ROXICET) 5-325 MG tablet Take 1 tablet by mouth every 4 (four) hours as needed for severe pain.    . OXYGEN Inhale 2 L into the lungs continuous.    . prochlorperazine (COMPAZINE) 10 MG tablet Take 1 tablet (10 mg total) by mouth every 6 (six) hours as needed (Nausea or vomiting). 60 tablet 2  . simvastatin (ZOCOR) 40 MG tablet Take 40 mg by mouth daily.     . SYMBICORT 160-4.5 MCG/ACT inhaler Inhale 2 puffs into the lungs 2 (two) times daily.    . tamsulosin (FLOMAX) 0.4 MG CAPS capsule TAKE 1 TABLET BY MOUTH EVERY NIGHT BEFORE BEDTIME    . tiotropium (SPIRIVA HANDIHALER) 18 MCG inhalation capsule Place 18 mcg into inhaler and inhale at bedtime.      No current facility-administered medications for this visit.    Facility-Administered Medications Ordered in Other Visits  Medication Dose Route Frequency Provider Last Rate Last Dose  . heparin lock flush 100 unit/mL  500 Units Intravenous Once Lloyd Huger, MD      . sodium chloride flush (NS) 0.9 % injection 10 mL  10 mL Intravenous PRN Lloyd Huger, MD   10 mL at 01/06/17 0830    OBJECTIVE: Vitals:   02/03/17 1044  BP: 109/63  Pulse: 79  Resp: 18  Temp: 97.9 F (36.6 C)     Body mass index is 24.21 kg/m.    ECOG FS:2 - Symptomatic, <50% confined to bed  General: Well-developed, well-nourished, no acute distress. Eyes: Pink conjunctiva, anicteric sclera. Lungs: Clear to auscultation bilaterally. Heart: Regular rate and rhythm. No rubs, murmurs, or gallops. Abdomen:  Soft, nontender, nondistended. No organomegaly noted, normoactive bowel sounds. Musculoskeletal: No edema, cyanosis, or clubbing. Neuro: Alert, answering all questions appropriately. Cranial nerves grossly intact. Skin: No rashes or petechiae noted. Psych: Normal affect.   LAB RESULTS:  Lab Results  Component Value Date   NA 134 (L) 02/03/2017   K 4.3 02/03/2017   CL 99 (L) 02/03/2017   CO2 28 02/03/2017   GLUCOSE 156 (H) 02/03/2017   BUN 32 (H) 02/03/2017   CREATININE 0.87 02/03/2017   CALCIUM 9.4 02/03/2017   PROT 6.9 02/03/2017   ALBUMIN 3.1 (L) 02/03/2017   AST 25 02/03/2017   ALT 29 02/03/2017   ALKPHOS 64 02/03/2017   BILITOT 0.3 02/03/2017  GFRNONAA >60 02/03/2017   GFRAA >60 02/03/2017    Lab Results  Component Value Date   WBC 7.9 02/03/2017   NEUTROABS 7.4 (H) 02/03/2017   HGB 8.3 (L) 02/03/2017   HCT 25.3 (L) 02/03/2017   MCV 77.5 (L) 02/03/2017   PLT 304 02/03/2017     STUDIES: No results found.  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  PLAN:    1. 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 MRI the brain reviewed independently and reported as above with multiple scattered lesions too small to determine whether they are metastatic disease or not. Will repeat MRI in 2-3 months. Continue daily XRT, completing on February 08, 2017. Proceed with cycle 5 of weekly carboplatinum and Taxol. Return to clinic in 3 weeks for consideration of additional chemotherapy with increased doses of carboplatinum 6 AUC and Taxol 175 mg/m. Foundation 1 testing has also been sent.  2. Flank pain: Likely secondary to lateral chest wall invasion of lung mass. Improving. Patient has been instructed to retry his fentanyl patch and continue oxycodone as needed. Continue XRT as above.  3. Bladder cancer: Patient known to have  high-grade noninvasive urothelial carcinoma. His treatment with carboplatinum may be of some benefit, but in the future can consider a chemotherapeutic regimen for his lung cancer that would also benefit bladder cancer such as cisplatin and gemcitabine.  4. Poor appetite: Improved.  5. Anemia: Likely contributing to patient's weakness and fatigue. He does not require blood transfusion at this time, but will consider one in the future if necessary. Monitor.  Patient expressed understanding and was in agreement with this plan. He also understands that He can call clinic at any time with any questions, concerns, or complaints.   Cancer Staging Primary lung squamous cell carcinoma, right [facility]) Staging form: Lung, AJCC 8th Edition - Clinical stage from 12/23/2016: Stage IVA (cT3, cN3, pM1a) - Signed by Lloyd Huger, MD on 12/23/2016   Lloyd Huger, MD   02/03/2017 10:56 AM

## 2017-02-03 ENCOUNTER — Inpatient Hospital Stay: Payer: Medicare Other

## 2017-02-03 ENCOUNTER — Ambulatory Visit
Admission: RE | Admit: 2017-02-03 | Discharge: 2017-02-03 | Disposition: A | Discharge status: Home / Self Care | Payer: Medicare Other | Source: Ambulatory Visit | Attending: Radiation Oncology | Admitting: Radiation Oncology

## 2017-02-03 ENCOUNTER — Inpatient Hospital Stay (HOSPITAL_BASED_OUTPATIENT_CLINIC_OR_DEPARTMENT_OTHER): Payer: Medicare Other | Admitting: Oncology

## 2017-02-03 VITALS — BP 109/63 | HR 79 | Temp 97.9°F | Resp 18 | Wt 145.5 lb

## 2017-02-03 DIAGNOSIS — Z923 Personal history of irradiation: Secondary | ICD-10-CM

## 2017-02-03 DIAGNOSIS — R630 Anorexia: Secondary | ICD-10-CM | POA: Diagnosis not present

## 2017-02-03 DIAGNOSIS — C3411 Malignant neoplasm of upper lobe, right bronchus or lung: Secondary | ICD-10-CM | POA: Diagnosis not present

## 2017-02-03 DIAGNOSIS — E119 Type 2 diabetes mellitus without complications: Secondary | ICD-10-CM | POA: Diagnosis not present

## 2017-02-03 DIAGNOSIS — J449 Chronic obstructive pulmonary disease, unspecified: Secondary | ICD-10-CM | POA: Diagnosis not present

## 2017-02-03 DIAGNOSIS — Z7984 Long term (current) use of oral hypoglycemic drugs: Secondary | ICD-10-CM

## 2017-02-03 DIAGNOSIS — R079 Chest pain, unspecified: Secondary | ICD-10-CM | POA: Diagnosis not present

## 2017-02-03 DIAGNOSIS — M129 Arthropathy, unspecified: Secondary | ICD-10-CM

## 2017-02-03 DIAGNOSIS — C7800 Secondary malignant neoplasm of unspecified lung: Secondary | ICD-10-CM | POA: Diagnosis not present

## 2017-02-03 DIAGNOSIS — C3491 Malignant neoplasm of unspecified part of right bronchus or lung: Secondary | ICD-10-CM

## 2017-02-03 DIAGNOSIS — Z87442 Personal history of urinary calculi: Secondary | ICD-10-CM

## 2017-02-03 DIAGNOSIS — Z8701 Personal history of pneumonia (recurrent): Secondary | ICD-10-CM

## 2017-02-03 DIAGNOSIS — R0602 Shortness of breath: Secondary | ICD-10-CM

## 2017-02-03 DIAGNOSIS — C7951 Secondary malignant neoplasm of bone: Secondary | ICD-10-CM | POA: Diagnosis not present

## 2017-02-03 DIAGNOSIS — C679 Malignant neoplasm of bladder, unspecified: Secondary | ICD-10-CM | POA: Diagnosis not present

## 2017-02-03 DIAGNOSIS — E785 Hyperlipidemia, unspecified: Secondary | ICD-10-CM | POA: Diagnosis not present

## 2017-02-03 DIAGNOSIS — Z85828 Personal history of other malignant neoplasm of skin: Secondary | ICD-10-CM

## 2017-02-03 DIAGNOSIS — R531 Weakness: Secondary | ICD-10-CM

## 2017-02-03 DIAGNOSIS — G939 Disorder of brain, unspecified: Secondary | ICD-10-CM

## 2017-02-03 DIAGNOSIS — R5383 Other fatigue: Secondary | ICD-10-CM

## 2017-02-03 DIAGNOSIS — Z9221 Personal history of antineoplastic chemotherapy: Secondary | ICD-10-CM

## 2017-02-03 DIAGNOSIS — Z7982 Long term (current) use of aspirin: Secondary | ICD-10-CM

## 2017-02-03 DIAGNOSIS — Z87891 Personal history of nicotine dependence: Secondary | ICD-10-CM

## 2017-02-03 DIAGNOSIS — I10 Essential (primary) hypertension: Secondary | ICD-10-CM

## 2017-02-03 DIAGNOSIS — G4730 Sleep apnea, unspecified: Secondary | ICD-10-CM

## 2017-02-03 DIAGNOSIS — Z79899 Other long term (current) drug therapy: Secondary | ICD-10-CM

## 2017-02-03 DIAGNOSIS — Z803 Family history of malignant neoplasm of breast: Secondary | ICD-10-CM

## 2017-02-03 LAB — CBC WITH DIFFERENTIAL/PLATELET
BASOS ABS: 0 10*3/uL (ref 0–0.1)
BASOS PCT: 0 %
Eosinophils Absolute: 0 10*3/uL (ref 0–0.7)
Eosinophils Relative: 0 %
HCT: 25.3 % — ABNORMAL LOW (ref 40.0–52.0)
Hemoglobin: 8.3 g/dL — ABNORMAL LOW (ref 13.0–18.0)
LYMPHS PCT: 5 %
Lymphs Abs: 0.4 10*3/uL — ABNORMAL LOW (ref 1.0–3.6)
MCH: 25.4 pg — ABNORMAL LOW (ref 26.0–34.0)
MCHC: 32.7 g/dL (ref 32.0–36.0)
MCV: 77.5 fL — ABNORMAL LOW (ref 80.0–100.0)
MONO ABS: 0.2 10*3/uL (ref 0.2–1.0)
Monocytes Relative: 2 %
NEUTROS ABS: 7.4 10*3/uL — AB (ref 1.4–6.5)
Neutrophils Relative %: 93 %
PLATELETS: 304 10*3/uL (ref 150–440)
RBC: 3.26 MIL/uL — ABNORMAL LOW (ref 4.40–5.90)
RDW: 20.3 % — AB (ref 11.5–14.5)
WBC: 7.9 10*3/uL (ref 3.8–10.6)

## 2017-02-03 LAB — COMPREHENSIVE METABOLIC PANEL
ALBUMIN: 3.1 g/dL — AB (ref 3.5–5.0)
ALT: 29 U/L (ref 17–63)
ANION GAP: 7 (ref 5–15)
AST: 25 U/L (ref 15–41)
Alkaline Phosphatase: 64 U/L (ref 38–126)
BUN: 32 mg/dL — AB (ref 6–20)
CHLORIDE: 99 mmol/L — AB (ref 101–111)
CO2: 28 mmol/L (ref 22–32)
Calcium: 9.4 mg/dL (ref 8.9–10.3)
Creatinine, Ser: 0.87 mg/dL (ref 0.61–1.24)
GFR calc Af Amer: >60 mL/min (ref >60)
GFR calc non Af Amer: >60 mL/min (ref >60)
GLUCOSE: 156 mg/dL — AB (ref 65–99)
POTASSIUM: 4.3 mmol/L (ref 3.5–5.1)
SODIUM: 134 mmol/L — AB (ref 135–145)
Total Bilirubin: 0.3 mg/dL (ref 0.3–1.2)
Total Protein: 6.9 g/dL (ref 6.5–8.1)

## 2017-02-03 MED ORDER — SODIUM CHLORIDE 0.9 % IV SOLN
Freq: Once | INTRAVENOUS | Status: AC
Start: 2017-02-03 — End: 2017-02-03
  Administered 2017-02-03: 12:00:00 via INTRAVENOUS
  Filled 2017-02-03: qty 1000

## 2017-02-03 MED ORDER — HEPARIN SOD (PORK) LOCK FLUSH 100 UNIT/ML IV SOLN: 500.0000 [IU] | Freq: Once | INTRAVENOUS | Status: AC

## 2017-02-03 MED ORDER — SODIUM CHLORIDE 0.9 % IV SOLN
170.0000 mg | Freq: Once | INTRAVENOUS | Status: AC
  Administered 2017-02-03: 170 mg via INTRAVENOUS
  Filled 2017-02-03: qty 17

## 2017-02-03 MED ORDER — SODIUM CHLORIDE 0.9% FLUSH
10.0000 mL | Freq: Once | INTRAVENOUS | Status: AC
  Administered 2017-02-03: 10 mL via INTRAVENOUS
  Filled 2017-02-03: qty 10

## 2017-02-03 MED ORDER — DIPHENHYDRAMINE HCL 50 MG/ML IJ SOLN
25.0000 mg | Freq: Once | INTRAMUSCULAR | Status: AC
  Administered 2017-02-03: 25 mg via INTRAVENOUS
  Filled 2017-02-03: qty 1

## 2017-02-03 MED ORDER — PALONOSETRON HCL INJECTION 0.25 MG/5ML
0.2500 mg | Freq: Once | INTRAVENOUS | Status: AC
  Administered 2017-02-03: 0.25 mg via INTRAVENOUS
  Filled 2017-02-03: qty 5

## 2017-02-03 MED ORDER — HEPARIN SOD (PORK) LOCK FLUSH 100 UNIT/ML IV SOLN
500.0000 [IU] | Freq: Once | INTRAVENOUS | Status: AC | PRN
  Administered 2017-02-03: 500 [IU]
  Filled 2017-02-03: qty 5

## 2017-02-03 MED ORDER — SODIUM CHLORIDE 0.9 % IV SOLN
45.0000 mg/m2 | Freq: Once | INTRAVENOUS | Status: AC
  Administered 2017-02-03: 84 mg via INTRAVENOUS
  Filled 2017-02-03: qty 14

## 2017-02-03 MED ORDER — FAMOTIDINE IN NACL 20-0.9 MG/50ML-% IV SOLN
20.0000 mg | Freq: Once | INTRAVENOUS | Status: AC
Start: 2017-02-03 — End: 2017-02-03
  Administered 2017-02-03: 20 mg via INTRAVENOUS
  Filled 2017-02-03: qty 50

## 2017-02-03 MED ORDER — DEXAMETHASONE SODIUM PHOSPHATE 10 MG/ML IJ SOLN
10.0000 mg | Freq: Once | INTRAMUSCULAR | Status: AC
  Administered 2017-02-03: 10 mg via INTRAVENOUS
  Filled 2017-02-03: qty 1

## 2017-02-03 NOTE — Progress Notes (Signed)
States feels weak and tired today. Has occasional pain but is well managed with pain medication.

## 2017-02-04 ENCOUNTER — Ambulatory Visit
Admission: RE | Admit: 2017-02-04 | Discharge: 2017-02-04 | Disposition: A | Discharge status: Home / Self Care | Payer: Medicare Other | Source: Ambulatory Visit | Attending: Radiation Oncology | Admitting: Radiation Oncology

## 2017-02-04 DIAGNOSIS — C3411 Malignant neoplasm of upper lobe, right bronchus or lung: Secondary | ICD-10-CM | POA: Diagnosis not present

## 2017-02-05 ENCOUNTER — Ambulatory Visit
Admission: RE | Admit: 2017-02-05 | Discharge: 2017-02-05 | Disposition: A | Discharge status: Home / Self Care | Payer: Medicare Other | Source: Ambulatory Visit | Attending: Radiation Oncology | Admitting: Radiation Oncology

## 2017-02-05 ENCOUNTER — Ambulatory Visit: Payer: Medicare Other

## 2017-02-05 DIAGNOSIS — C3411 Malignant neoplasm of upper lobe, right bronchus or lung: Secondary | ICD-10-CM | POA: Diagnosis not present

## 2017-02-08 ENCOUNTER — Ambulatory Visit
Admission: RE | Admit: 2017-02-08 | Discharge: 2017-02-08 | Disposition: A | Discharge status: Home / Self Care | Payer: Medicare Other | Source: Ambulatory Visit | Attending: Radiation Oncology | Admitting: Radiation Oncology

## 2017-02-08 DIAGNOSIS — C3411 Malignant neoplasm of upper lobe, right bronchus or lung: Secondary | ICD-10-CM | POA: Diagnosis not present

## 2017-02-08 NOTE — Progress Notes (Signed)
DISCONTINUE ON PATHWAY REGIMEN - Non-Small Cell Lung   [id number]:Carboplatin + Paclitaxel (2/50) + RT weekly x 6 weeks:   Administer weekly during RT:     Paclitaxel      Carboplatin   **Always confirm dose/schedule in your pharmacy ordering system**    REASON: Continuation Of Treatment PRIOR TREATMENT: Off Pathway: Carboplatin + Paclitaxel (2/50) + RT weekly x 6 weeks TREATMENT RESPONSE: Partial Response (PR)  START ON PATHWAY REGIMEN - Non-Small Cell Lung     A cycle is every 21 days:     Paclitaxel      Carboplatin   **Always confirm dose/schedule in your pharmacy ordering system**    Patient Characteristics: Stage IV Metastatic, Squamous, PS = 0, 1, First Line, PD-L1 Expression Positive 1-49% (TPS) / Negative / Not Tested / Not a Candidate for Immunotherapy AJCC T Category: T3 Current Disease Status: Distant Metastases AJCC N Category: N3 AJCC M Category: M1a AJCC 8 Stage Grouping: IVA Histology: Squamous Cell Line of therapy: First Line PD-L1 Expression Status: Test Not Ordered Performance Status: PS = 0, 1 Would you be surprised if this patient died  in the next year? I would NOT be surprised if this patient died in the next year  Intent of Therapy: Non-Curative / Palliative Intent, Discussed with Patient 

## 2017-02-11 LAB — SURGICAL PATHOLOGY

## 2017-02-15 ENCOUNTER — Ambulatory Visit
Admission: RE | Admit: 2017-02-15 | Discharge: 2017-02-15 | Disposition: A | Discharge status: Home / Self Care | Payer: Medicare Other | Source: Ambulatory Visit | Attending: Radiation Oncology | Admitting: Radiation Oncology

## 2017-02-15 ENCOUNTER — Encounter: Payer: Self-pay | Admitting: Radiation Oncology

## 2017-02-15 ENCOUNTER — Telehealth: Payer: Self-pay | Admitting: Cardiology

## 2017-02-15 VITALS — BP 131/70 | HR 89 | Temp 98.3°F | Resp 21

## 2017-02-15 DIAGNOSIS — C3411 Malignant neoplasm of upper lobe, right bronchus or lung: Secondary | ICD-10-CM

## 2017-02-15 NOTE — Progress Notes (Signed)
Radiation Oncology Follow up Note  Name: [name] [name]   Date:   02/15/2017 MRN:  [id number] DOB: Jul 22, 1941    This 76 y.o. 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 His chest wall pain is resolved. 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 No cough hemoptysis or chest tightness..  COMPLICATIONS OF TREATMENT: none  FOLLOW UP COMPLIANCE: keeps appointments   PHYSICAL EXAM:  BP 131/70   Pulse 89   Temp 98.3 F (36.8 C)   Resp (!) 21  Frail well-developed male wheelchair-bound in NAD. He is on nasal oxygen. Well-developed well-nourished patient in NAD. HEENT reveals PERLA, EOMI, discs not visualized.  Oral cavity is clear. No oral mucosal lesions are identified. Neck is clear without evidence of cervical or supraclavicular adenopathy. Lungs are clear to A&P. Cardiac examination is essentially unremarkable with regular rate and rhythm without murmur rub or thrill. Abdomen is benign with no organomegaly or masses noted. Motor sensory and DTR levels are equal and symmetric in the upper and lower extremities. Cranial nerves II through XII are grossly intact. Proprioception is intact. No peripheral adenopathy or edema is identified. No motor or sensory levels are noted. Crude visual fields are within normal range.  RADIOLOGY RESULTS: We'll rescan him later this week for possible small field lung boost  PLAN: Present time patient is slowly recovering from his initial course of radiation therapy to his chest he's had excellent response as far as pain in the chest is concerned. I will rescan him for possibility of small field lung boost. I  will make that choice on how much she is responded over the first course of therapy. We'll also discussed with medical oncology further chemotherapy along with possible further radiation therapy.  I would like to take this opportunity to thank you for allowing me to participate in the care of your patient.Armstead Peaks., MD

## 2017-02-15 NOTE — Telephone Encounter (Signed)
Attempted to schedule echo patient refused at this time seeking chemo/ radiation at this time

## 2017-02-16 ENCOUNTER — Encounter: Payer: Self-pay | Admitting: Oncology

## 2017-02-18 ENCOUNTER — Ambulatory Visit
Admission: RE | Admit: 2017-02-18 | Discharge: 2017-02-18 | Disposition: A | Discharge status: Home / Self Care | Payer: Medicare Other | Source: Ambulatory Visit | Attending: Radiation Oncology | Admitting: Radiation Oncology

## 2017-02-18 DIAGNOSIS — C3411 Malignant neoplasm of upper lobe, right bronchus or lung: Secondary | ICD-10-CM | POA: Diagnosis not present

## 2017-02-22 ENCOUNTER — Encounter: Payer: Self-pay | Admitting: Oncology

## 2017-02-22 DIAGNOSIS — C3411 Malignant neoplasm of upper lobe, right bronchus or lung: Secondary | ICD-10-CM | POA: Diagnosis not present

## 2017-02-23 NOTE — Progress Notes (Signed)
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  INTERVAL HISTORY: Patient returns to clinic today for further evaluation and consideration of cycle 6 of weekly carboplatinum and Taxol. He is tolerating his daily XRT. He has increased weakness and fatigue today. He also is orthostatic with dizziness upon standing. He has a decreased appetite. His pain is better controlled. He has no neurologic complaints. He denies any recent fevers or illnesses. He denies any shortness of breath, cough, or hemoptysis. He denies any nausea, vomiting, constipation, or diarrhea. He has no melena or hematochezia. He has no urinary complaints. Patient otherwise feels well and offers no further specific complaints.  REVIEW OF SYSTEMS:   Review of Systems  Constitutional: Positive for malaise/fatigue. Negative for fever and weight loss.  Respiratory: Negative.  Negative for cough and shortness of breath.   Cardiovascular: Negative for chest pain and leg swelling.  Gastrointestinal: Negative.  Negative for abdominal pain.  Genitourinary: Negative.  Negative for flank pain.  Musculoskeletal: Negative.   Neurological: Positive for dizziness and weakness. Negative for sensory change.  Psychiatric/Behavioral: The patient is nervous/anxious.     As per HPI. Otherwise, a complete review of systems is negative.  PAST MEDICAL HISTORY: Past Medical History:  Diagnosis Date  . Arthritis   . Basal cell carcinoma of skin   . Bladder cancer ([place])   . COPD (chronic obstructive pulmonary disease) ([place])   . Diabetes mellitus without complication ([place])   . Dyspnea    with exertion  . History of kidney stones   . Hyperlipidemia   . Hypertension   . Kidney  stone   . Pneumonia   . Sleep apnea   . Squamous cell skin cancer     PAST SURGICAL HISTORY: Past Surgical History:  Procedure Laterality Date  . BLADDER FULGURATION    . HERNIA REPAIR    . PORTA CATH INSERTION N/A 12/28/2016   Procedure: Glori Luis Cath Insertion;  Surgeon: Algernon Huxley, MD;  Location: [facility];  Service: Cardiovascular;  Laterality: N/A;    FAMILY HISTORY: Family History  Problem Relation Age of Onset  . Breast cancer Mother   . Stroke Father   . Diabetes Father   . Prostate cancer Neg Hx   . Bladder Cancer Neg Hx   . Kidney cancer Neg Hx     ADVANCED DIRECTIVES (Y/N):  N  HEALTH MAINTENANCE: Social History  Substance Use Topics  . Smoking status: Former Smoker    Years: 60.00    Quit date: 12/14/2013  . Smokeless tobacco: Current User    Types: Snuff  . Alcohol use No     Colonoscopy:  PAP:  Bone density:  Lipid panel:  No Known Allergies  Current Outpatient Prescriptions  Medication Sig Dispense Refill  . acetaminophen (TYLENOL) 500 MG tablet Take 500 mg by mouth every 6 (six) hours as needed for moderate pain or headache.    . albuterol (PROVENTIL HFA;VENTOLIN HFA) 108 (90 Base) MCG/ACT inhaler Inhale 1-2 puffs into the lungs every 6 (six) hours as needed for wheezing or shortness of breath.     Marland Kitchen albuterol (PROVENTIL) (2.5 MG/3ML) 0.083% nebulizer solution Take 2.5 mg by nebulization every 4 (four) hours as needed for wheezing or shortness of breath.     Marland Kitchen  Cholecalciferol (VITAMIN D) 2000 units CAPS Take 2,000 Units by mouth daily.    . diphenhydramine-acetaminophen (TYLENOL PM) 25-500 MG TABS tablet Take 1-2 tablets by mouth at bedtime as needed (sleep).    Marland Kitchen lisinopril (PRINIVIL,ZESTRIL) 40 MG tablet Take 40 mg by mouth daily.    . Omega-3 Fatty Acids (FISH OIL) 1200 MG CAPS Take 1,200 mg by mouth daily.    . ondansetron (ZOFRAN) 8 MG tablet     . oxyCODONE-acetaminophen (PERCOCET/ROXICET) 5-325 MG tablet Take 1 tablet by mouth  every 4 (four) hours as needed for severe pain. 60 tablet 0  . OXYGEN Inhale 2 L into the lungs continuous.    . SYMBICORT 160-4.5 MCG/ACT inhaler Inhale 2 puffs into the lungs 2 (two) times daily.    . tamsulosin (FLOMAX) 0.4 MG CAPS capsule TAKE 1 TABLET BY MOUTH EVERY NIGHT BEFORE BEDTIME    . aspirin 81 MG chewable tablet TAKE 1 TABLET (81 MG) BY MOUTH AT NIGHT    . colchicine 0.6 MG tablet Take 0.6 mg by mouth daily.    Marland Kitchen dexamethasone (DECADRON) 4 MG tablet Take 1 tablet (4 mg total) by mouth daily. (Patient not taking: Reported on 02/24/2017) 25 tablet 0  . glimepiride (AMARYL) 2 MG tablet Take 2 mg by mouth daily.     . metFORMIN (GLUCOPHAGE-XR) 500 MG 24 hr tablet Take 1,000 mg by mouth 2 (two) times daily.     . simvastatin (ZOCOR) 40 MG tablet Take 40 mg by mouth daily.     Marland Kitchen tiotropium (SPIRIVA HANDIHALER) 18 MCG inhalation capsule Place 18 mcg into inhaler and inhale at bedtime.      No current facility-administered medications for this visit.    Facility-Administered Medications Ordered in Other Visits  Medication Dose Route Frequency Provider Last Rate Last Dose  . sodium chloride flush (NS) 0.9 % injection 10 mL  10 mL Intravenous PRN Lloyd Huger, MD   10 mL at 01/06/17 0830    OBJECTIVE: Vitals:   02/24/17 0928 02/24/17 0933  BP: 103/62 (!) 83/51  Pulse: 88 91  Resp: 20   Temp: 97.8 F (36.6 C)      Body mass index is 24.66 kg/m.    ECOG FS:2 - Symptomatic, <50% confined to bed  General: Well-developed, well-nourished, no acute distress. Eyes: Pink conjunctiva, anicteric sclera. Lungs: Clear to auscultation bilaterally. Heart: Regular rate and rhythm. No rubs, murmurs, or gallops. Abdomen: Soft, nontender, nondistended. No organomegaly noted, normoactive bowel sounds. Musculoskeletal: No edema, cyanosis, or clubbing. Neuro: Alert, answering all questions appropriately. Cranial nerves grossly intact. Skin: No rashes or petechiae noted. Psych: Normal  affect.   LAB RESULTS:  Lab Results  Component Value Date   NA 136 02/24/2017   K 4.0 02/24/2017   CL 99 (L) 02/24/2017   CO2 32 02/24/2017   GLUCOSE 221 (H) 02/24/2017   BUN 21 (H) 02/24/2017   CREATININE 0.91 02/24/2017   CALCIUM 8.9 02/24/2017   PROT 6.3 (L) 02/24/2017   ALBUMIN 2.7 (L) 02/24/2017   AST 20 02/24/2017   ALT 33 02/24/2017   ALKPHOS 80 02/24/2017   BILITOT 0.5 02/24/2017   GFRNONAA >60 02/24/2017   GFRAA >60 02/24/2017    Lab Results  Component Value Date   WBC 2.1 (L) 02/24/2017   NEUTROABS 0.9 (L) 02/24/2017   HGB 7.4 (L) 02/24/2017   HCT 22.0 (L) 02/24/2017   MCV 82.5 02/24/2017   PLT 234 02/24/2017     STUDIES: No results found.  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  PLAN:    1. 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 MRI the brain reviewed independently and reported as above with multiple scattered lesions too small to determine whether they are metastatic disease or not. Will repeat MRI in 2-3 months. Continue daily XRT, completing his boost on Mar 12, 2017. Delay cycle 6 of weekly carboplatinum and Taxol. Patient will instead receive 1 L IV fluids today.   Return to clinic in 1 week for reconsideration of treatment. Patient's PDL-1 testing is 1%, although this is low he still could be considered for immunotherapy as second line treatment.  Foundation 1 testing has also been sent.  2. Flank pain: Likely secondary to lateral chest wall invasion of lung mass. Nearly resolved. Patient has been instructed to retry his fentanyl patch and continue oxycodone as needed. Continue XRT as above.  3. Bladder cancer: Patient known to have high-grade noninvasive urothelial carcinoma. His treatment with carboplatinum may be of some benefit, but in the future can consider a chemotherapeutic regimen  for his lung cancer that would also benefit bladder cancer such as cisplatin and gemcitabine.  4. Poor appetite: Improved.  5. Anemia: Likely contributing to patient's weakness and fatigue. Return to clinic on Friday for consideration of one unit of packed red blood cells.  Patient understands that He can call clinic at any time with any questions, concerns, or complaints.   Cancer Staging Primary lung squamous cell carcinoma, right [facility]) Staging form: Lung, AJCC 8th Edition - Clinical stage from 12/23/2016: Stage IVA (cT3, cN3, pM1a) - Signed by Lloyd Huger, MD on 12/23/2016   Lloyd Huger, MD   02/26/2017 12:45 PM

## 2017-02-24 ENCOUNTER — Inpatient Hospital Stay: Payer: Medicare Other | Attending: Oncology

## 2017-02-24 ENCOUNTER — Inpatient Hospital Stay (HOSPITAL_BASED_OUTPATIENT_CLINIC_OR_DEPARTMENT_OTHER): Payer: Medicare Other | Admitting: Oncology

## 2017-02-24 ENCOUNTER — Inpatient Hospital Stay: Payer: Medicare Other

## 2017-02-24 VITALS — BP 83/51 | HR 91 | Temp 97.8°F | Resp 20 | Wt 148.2 lb

## 2017-02-24 DIAGNOSIS — Z923 Personal history of irradiation: Secondary | ICD-10-CM | POA: Insufficient documentation

## 2017-02-24 DIAGNOSIS — Z87891 Personal history of nicotine dependence: Secondary | ICD-10-CM | POA: Insufficient documentation

## 2017-02-24 DIAGNOSIS — E785 Hyperlipidemia, unspecified: Secondary | ICD-10-CM

## 2017-02-24 DIAGNOSIS — R531 Weakness: Secondary | ICD-10-CM

## 2017-02-24 DIAGNOSIS — Z7984 Long term (current) use of oral hypoglycemic drugs: Secondary | ICD-10-CM | POA: Insufficient documentation

## 2017-02-24 DIAGNOSIS — C7800 Secondary malignant neoplasm of unspecified lung: Secondary | ICD-10-CM | POA: Diagnosis not present

## 2017-02-24 DIAGNOSIS — C3411 Malignant neoplasm of upper lobe, right bronchus or lung: Secondary | ICD-10-CM | POA: Insufficient documentation

## 2017-02-24 DIAGNOSIS — R109 Unspecified abdominal pain: Secondary | ICD-10-CM

## 2017-02-24 DIAGNOSIS — Z79899 Other long term (current) drug therapy: Secondary | ICD-10-CM | POA: Insufficient documentation

## 2017-02-24 DIAGNOSIS — R42 Dizziness and giddiness: Secondary | ICD-10-CM | POA: Insufficient documentation

## 2017-02-24 DIAGNOSIS — I10 Essential (primary) hypertension: Secondary | ICD-10-CM | POA: Insufficient documentation

## 2017-02-24 DIAGNOSIS — Z5111 Encounter for antineoplastic chemotherapy: Secondary | ICD-10-CM | POA: Diagnosis not present

## 2017-02-24 DIAGNOSIS — C679 Malignant neoplasm of bladder, unspecified: Secondary | ICD-10-CM | POA: Insufficient documentation

## 2017-02-24 DIAGNOSIS — Z85828 Personal history of other malignant neoplasm of skin: Secondary | ICD-10-CM | POA: Insufficient documentation

## 2017-02-24 DIAGNOSIS — R5383 Other fatigue: Secondary | ICD-10-CM | POA: Diagnosis not present

## 2017-02-24 DIAGNOSIS — C3491 Malignant neoplasm of unspecified part of right bronchus or lung: Secondary | ICD-10-CM

## 2017-02-24 DIAGNOSIS — G4730 Sleep apnea, unspecified: Secondary | ICD-10-CM | POA: Diagnosis not present

## 2017-02-24 DIAGNOSIS — R0602 Shortness of breath: Secondary | ICD-10-CM

## 2017-02-24 DIAGNOSIS — Z87442 Personal history of urinary calculi: Secondary | ICD-10-CM | POA: Insufficient documentation

## 2017-02-24 DIAGNOSIS — J449 Chronic obstructive pulmonary disease, unspecified: Secondary | ICD-10-CM | POA: Diagnosis not present

## 2017-02-24 DIAGNOSIS — E119 Type 2 diabetes mellitus without complications: Secondary | ICD-10-CM

## 2017-02-24 DIAGNOSIS — M129 Arthropathy, unspecified: Secondary | ICD-10-CM | POA: Diagnosis not present

## 2017-02-24 DIAGNOSIS — C7951 Secondary malignant neoplasm of bone: Secondary | ICD-10-CM | POA: Diagnosis not present

## 2017-02-24 DIAGNOSIS — Z8701 Personal history of pneumonia (recurrent): Secondary | ICD-10-CM | POA: Insufficient documentation

## 2017-02-24 DIAGNOSIS — Z803 Family history of malignant neoplasm of breast: Secondary | ICD-10-CM | POA: Insufficient documentation

## 2017-02-24 DIAGNOSIS — R634 Abnormal weight loss: Secondary | ICD-10-CM | POA: Diagnosis not present

## 2017-02-24 DIAGNOSIS — D649 Anemia, unspecified: Secondary | ICD-10-CM | POA: Insufficient documentation

## 2017-02-24 LAB — COMPREHENSIVE METABOLIC PANEL
ALBUMIN: 2.7 g/dL — AB (ref 3.5–5.0)
ALK PHOS: 80 U/L (ref 38–126)
ALT: 33 U/L (ref 17–63)
AST: 20 U/L (ref 15–41)
Anion gap: 5 (ref 5–15)
BUN: 21 mg/dL — ABNORMAL HIGH (ref 6–20)
CALCIUM: 8.9 mg/dL (ref 8.9–10.3)
CO2: 32 mmol/L (ref 22–32)
CREATININE: 0.91 mg/dL (ref 0.61–1.24)
Chloride: 99 mmol/L — ABNORMAL LOW (ref 101–111)
GFR calc Af Amer: >60 mL/min (ref >60)
GFR calc non Af Amer: >60 mL/min (ref >60)
GLUCOSE: 221 mg/dL — AB (ref 65–99)
Potassium: 4 mmol/L (ref 3.5–5.1)
Sodium: 136 mmol/L (ref 135–145)
Total Bilirubin: 0.5 mg/dL (ref 0.3–1.2)
Total Protein: 6.3 g/dL — ABNORMAL LOW (ref 6.5–8.1)

## 2017-02-24 LAB — CBC WITH DIFFERENTIAL/PLATELET
Basophils Absolute: 0 10*3/uL (ref 0–0.1)
Basophils Relative: 0 %
Eosinophils Absolute: 0 10*3/uL (ref 0–0.7)
Eosinophils Relative: 1 %
HCT: 22 % — ABNORMAL LOW (ref 40.0–52.0)
HEMOGLOBIN: 7.4 g/dL — AB (ref 13.0–18.0)
LYMPHS ABS: 1.1 10*3/uL (ref 1.0–3.6)
LYMPHS PCT: 51 %
MCH: 27.8 pg (ref 26.0–34.0)
MCHC: 33.7 g/dL (ref 32.0–36.0)
MCV: 82.5 fL (ref 80.0–100.0)
MONOS PCT: 4 %
Monocytes Absolute: 0.1 10*3/uL — ABNORMAL LOW (ref 0.2–1.0)
NEUTROS PCT: 44 %
Neutro Abs: 0.9 10*3/uL — ABNORMAL LOW (ref 1.4–6.5)
Platelets: 234 10*3/uL (ref 150–440)
RBC: 2.66 MIL/uL — AB (ref 4.40–5.90)
RDW: 28.3 % — ABNORMAL HIGH (ref 11.5–14.5)
WBC: 2.1 10*3/uL — AB (ref 3.8–10.6)

## 2017-02-24 LAB — PREPARE RBC (CROSSMATCH)

## 2017-02-24 MED ORDER — SODIUM CHLORIDE 0.9% FLUSH
10.0000 mL | Freq: Once | INTRAVENOUS | Status: AC
  Administered 2017-02-24: 10 mL via INTRAVENOUS
  Filled 2017-02-24: qty 10

## 2017-02-24 MED ORDER — SODIUM CHLORIDE 0.9 % IV SOLN
INTRAVENOUS | Status: DC
  Administered 2017-02-24: 11:00:00 via INTRAVENOUS
  Filled 2017-02-24: qty 1000

## 2017-02-24 MED ORDER — HEPARIN SOD (PORK) LOCK FLUSH 100 UNIT/ML IV SOLN
500.0000 [IU] | Freq: Once | INTRAVENOUS | Status: AC
  Administered 2017-02-24: 500 [IU] via INTRAVENOUS
  Filled 2017-02-24: qty 5

## 2017-02-24 MED ORDER — OXYCODONE-ACETAMINOPHEN 5-325 MG PO TABS: 1.0000 | ORAL_TABLET | ORAL | 0 refills | Status: AC | PRN

## 2017-02-24 NOTE — Progress Notes (Signed)
Patient orthostatic in clinic today with dizziness

## 2017-02-25 ENCOUNTER — Ambulatory Visit
Admission: RE | Admit: 2017-02-25 | Discharge: 2017-02-25 | Disposition: A | Discharge status: Home / Self Care | Payer: Medicare Other | Source: Ambulatory Visit | Attending: Radiation Oncology | Admitting: Radiation Oncology

## 2017-02-25 DIAGNOSIS — C3411 Malignant neoplasm of upper lobe, right bronchus or lung: Secondary | ICD-10-CM | POA: Diagnosis not present

## 2017-02-26 ENCOUNTER — Inpatient Hospital Stay: Payer: Medicare Other

## 2017-02-26 DIAGNOSIS — C3411 Malignant neoplasm of upper lobe, right bronchus or lung: Secondary | ICD-10-CM | POA: Diagnosis not present

## 2017-02-26 DIAGNOSIS — C3491 Malignant neoplasm of unspecified part of right bronchus or lung: Secondary | ICD-10-CM

## 2017-02-26 MED ORDER — SODIUM CHLORIDE 0.9 % IV SOLN
250.0000 mL | Freq: Once | INTRAVENOUS | Status: AC
  Administered 2017-02-26: 250 mL via INTRAVENOUS
  Filled 2017-02-26: qty 250

## 2017-02-26 MED ORDER — DIPHENHYDRAMINE HCL 50 MG/ML IJ SOLN
25.0000 mg | Freq: Once | INTRAMUSCULAR | Status: AC
  Administered 2017-02-26: 25 mg via INTRAVENOUS
  Filled 2017-02-26: qty 1

## 2017-02-26 MED ORDER — SODIUM CHLORIDE 0.9% FLUSH
10.0000 mL | INTRAVENOUS | Status: AC | PRN
Start: 2017-02-26 — End: 2017-02-26
  Administered 2017-02-26: 10 mL
  Filled 2017-02-26: qty 10

## 2017-02-26 MED ORDER — HEPARIN SOD (PORK) LOCK FLUSH 100 UNIT/ML IV SOLN
500.0000 [IU] | Freq: Every day | INTRAVENOUS | Status: AC | PRN
  Administered 2017-02-26: 500 [IU]
  Filled 2017-02-26: qty 5

## 2017-02-26 MED ORDER — ACETAMINOPHEN 325 MG PO TABS
650.0000 mg | ORAL_TABLET | Freq: Once | ORAL | Status: AC
  Administered 2017-02-26: 650 mg via ORAL
  Filled 2017-02-26: qty 2

## 2017-02-27 LAB — TYPE AND SCREEN
ABO/RH(D): A POS
ANTIBODY SCREEN: NEGATIVE
UNIT DIVISION: 0

## 2017-02-27 LAB — BPAM RBC
Blood Product Expiration Date: 201805152359
ISSUE DATE / TIME: 201805110911
Unit Type and Rh: 600

## 2017-03-01 ENCOUNTER — Ambulatory Visit: Payer: Medicare Other

## 2017-03-01 ENCOUNTER — Ambulatory Visit
Admission: RE | Admit: 2017-03-01 | Discharge: 2017-03-01 | Disposition: A | Discharge status: Home / Self Care | Payer: Medicare Other | Source: Ambulatory Visit | Attending: Radiation Oncology | Admitting: Radiation Oncology

## 2017-03-01 DIAGNOSIS — C3411 Malignant neoplasm of upper lobe, right bronchus or lung: Secondary | ICD-10-CM | POA: Diagnosis not present

## 2017-03-02 ENCOUNTER — Ambulatory Visit
Admission: RE | Admit: 2017-03-02 | Discharge: 2017-03-02 | Disposition: A | Discharge status: Home / Self Care | Payer: Medicare Other | Source: Ambulatory Visit | Attending: Radiation Oncology | Admitting: Radiation Oncology

## 2017-03-02 ENCOUNTER — Ambulatory Visit: Payer: Medicare Other

## 2017-03-02 DIAGNOSIS — C3411 Malignant neoplasm of upper lobe, right bronchus or lung: Secondary | ICD-10-CM | POA: Diagnosis not present

## 2017-03-02 NOTE — Progress Notes (Signed)
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  INTERVAL HISTORY: Patient returns to clinic today for further evaluation and reconsideration of cycle 6 of weekly carboplatinum and Taxol. He is tolerating his daily XRT boost well. He and continues to have chronic weakness and fatigue, but this is improved since last week. He has a decreased appetite. His pain is better controlled. He has no neurologic complaints. He denies any recent fevers or illnesses. He denies any shortness of breath, cough, or hemoptysis. He denies any nausea, vomiting, constipation, or diarrhea. He has no melena or hematochezia. He has no urinary complaints. Patient otherwise feels well and offers no further specific complaints.  REVIEW OF SYSTEMS:   Review of Systems  Constitutional: Positive for malaise/fatigue. Negative for fever and weight loss.  Respiratory: Negative.  Negative for cough and shortness of breath.   Cardiovascular: Negative for chest pain and leg swelling.  Gastrointestinal: Negative.  Negative for abdominal pain.  Genitourinary: Negative.  Negative for flank pain.  Musculoskeletal: Negative.   Neurological: Positive for weakness. Negative for dizziness and sensory change.  Psychiatric/Behavioral: The patient is nervous/anxious.     As per HPI. Otherwise, a complete review of systems is negative.  PAST MEDICAL HISTORY: Past Medical History:  Diagnosis Date  . Arthritis   . Basal cell carcinoma of skin   . Bladder cancer ([place])   . COPD (chronic obstructive pulmonary disease) ([place])   . Diabetes mellitus without complication ([place])   . Dyspnea    with exertion  . History of kidney stones   . Hyperlipidemia   . Hypertension   .  Kidney stone   . Pneumonia   . Sleep apnea   . Squamous cell skin cancer     PAST SURGICAL HISTORY: Past Surgical History:  Procedure Laterality Date  . BLADDER FULGURATION    . HERNIA REPAIR    . PORTA CATH INSERTION N/A 12/28/2016   Procedure: Glori Luis Cath Insertion;  Surgeon: Algernon Huxley, MD;  Location: [facility];  Service: Cardiovascular;  Laterality: N/A;    FAMILY HISTORY: Family History  Problem Relation Age of Onset  . Breast cancer Mother   . Stroke Father   . Diabetes Father   . Prostate cancer Neg Hx   . Bladder Cancer Neg Hx   . Kidney cancer Neg Hx     ADVANCED DIRECTIVES (Y/N):  N  HEALTH MAINTENANCE: Social History  Substance Use Topics  . Smoking status: Former Smoker    Years: 60.00    Quit date: 12/14/2013  . Smokeless tobacco: Current User    Types: Snuff  . Alcohol use No     Colonoscopy:  PAP:  Bone density:  Lipid panel:  No Known Allergies  Current Outpatient Prescriptions  Medication Sig Dispense Refill  . acetaminophen (TYLENOL) 500 MG tablet Take 500 mg by mouth every 6 (six) hours as needed for moderate pain or headache.    . albuterol (PROVENTIL HFA;VENTOLIN HFA) 108 (90 Base) MCG/ACT inhaler Inhale 1-2 puffs into the lungs every 6 (six) hours as needed for wheezing or shortness of breath.     Marland Kitchen albuterol (PROVENTIL) (2.5 MG/3ML) 0.083% nebulizer solution Take 2.5 mg by nebulization every 4 (four) hours as needed for wheezing or  shortness of breath.     . Cholecalciferol (VITAMIN D) 2000 units CAPS Take 2,000 Units by mouth daily.    . colchicine 0.6 MG tablet Take 0.6 mg by mouth daily.    . diphenhydramine-acetaminophen (TYLENOL PM) 25-500 MG TABS tablet Take 1-2 tablets by mouth at bedtime as needed (sleep).    Marland Kitchen glimepiride (AMARYL) 2 MG tablet Take 2 mg by mouth daily.     . Omega-3 Fatty Acids (FISH OIL) 1200 MG CAPS Take 1,200 mg by mouth daily.    . ondansetron (ZOFRAN) 8 MG tablet     . oxyCODONE-acetaminophen  (PERCOCET/ROXICET) 5-325 MG tablet Take 1 tablet by mouth every 4 (four) hours as needed for severe pain. 60 tablet 0  . OXYGEN Inhale 2 L into the lungs continuous.    . simvastatin (ZOCOR) 40 MG tablet Take 40 mg by mouth daily.     . SYMBICORT 160-4.5 MCG/ACT inhaler Inhale 2 puffs into the lungs 2 (two) times daily.    . tamsulosin (FLOMAX) 0.4 MG CAPS capsule TAKE 1 TABLET BY MOUTH EVERY NIGHT BEFORE BEDTIME    . aspirin 81 MG chewable tablet TAKE 1 TABLET (81 MG) BY MOUTH AT NIGHT    . dexamethasone (DECADRON) 4 MG tablet Take 1 tablet (4 mg total) by mouth daily. (Patient not taking: Reported on 02/24/2017) 25 tablet 0  . lisinopril (PRINIVIL,ZESTRIL) 40 MG tablet Take 40 mg by mouth daily.    . metFORMIN (GLUCOPHAGE-XR) 500 MG 24 hr tablet Take 1,000 mg by mouth 2 (two) times daily.     Marland Kitchen tiotropium (SPIRIVA HANDIHALER) 18 MCG inhalation capsule Place 18 mcg into inhaler and inhale at bedtime.      No current facility-administered medications for this visit.    Facility-Administered Medications Ordered in Other Visits  Medication Dose Route Frequency Provider Last Rate Last Dose  . sodium chloride flush (NS) 0.9 % injection 10 mL  10 mL Intravenous PRN Lloyd Huger, MD   10 mL at 01/06/17 0830    OBJECTIVE: Vitals:   03/03/17 0926  BP: 118/75  Pulse: 76  Resp: 18  Temp: 97.9 F (36.6 C)     Body mass index is 25.39 kg/m.    ECOG FS:2 - Symptomatic, <50% confined to bed  General: Well-developed, well-nourished, no acute distress. Eyes: Pink conjunctiva, anicteric sclera. Lungs: Clear to auscultation bilaterally. Heart: Regular rate and rhythm. No rubs, murmurs, or gallops. Abdomen: Soft, nontender, nondistended. No organomegaly noted, normoactive bowel sounds. Musculoskeletal: No edema, cyanosis, or clubbing. Neuro: Alert, answering all questions appropriately. Cranial nerves grossly intact. Skin: No rashes or petechiae noted. Psych: Normal affect.   LAB  RESULTS:  Lab Results  Component Value Date   NA 139 03/03/2017   K 3.5 03/03/2017   CL 100 (L) 03/03/2017   CO2 32 03/03/2017   GLUCOSE 152 (H) 03/03/2017   BUN 14 03/03/2017   CREATININE 0.90 03/03/2017   CALCIUM 9.1 03/03/2017   PROT 6.5 03/03/2017   ALBUMIN 2.8 (L) 03/03/2017   AST 18 03/03/2017   ALT 23 03/03/2017   ALKPHOS 81 03/03/2017   BILITOT 0.3 03/03/2017   GFRNONAA >60 03/03/2017   GFRAA >60 03/03/2017    Lab Results  Component Value Date   WBC 2.6 (L) 03/03/2017   NEUTROABS 1.2 (L) 03/03/2017   HGB 8.6 (L) 03/03/2017   HCT 25.6 (L) 03/03/2017   MCV 84.5 03/03/2017   PLT 376 03/03/2017     STUDIES: No results found.  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  PLAN:    1. 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 MRI the brain on December 31, 2016 reviewed independently with multiple scattered lesions too small to determine whether they are metastatic disease or not. Will repeat MRI in 2-3 months. Continue daily XRT, completing his boost on Mar 12, 2017. Proceed with cycle 6 of weekly carboplatinum and Taxol. Return to clinic in 1 week for further evaluation and consideration of cycle 7. Patient's PDL-1 testing is 1%, although this is low he still could be considered for immunotherapy as second line treatment.  Foundation 1 testing has also been sent.  2. Flank pain: Likely secondary to lateral chest wall invasion of lung mass. Nearly resolved. Continue oxycodone as needed.  3. Bladder cancer: Patient known to have high-grade noninvasive urothelial carcinoma. His treatment with carboplatinum may be of some benefit, but in the future can consider a chemotherapeutic regimen for his lung cancer that would also benefit bladder cancer such as cisplatin and gemcitabine.  4. Poor appetite: Improved.  5. Anemia:  Likely contributing to patient's weakness and fatigue. Return to clinic on Friday for consideration of one unit of packed red blood cells.  Patient understands that He can call clinic at any time with any questions, concerns, or complaints.   Cancer Staging Primary lung squamous cell carcinoma, right [facility]) Staging form: Lung, AJCC 8th Edition - Clinical stage from 12/23/2016: Stage IVA (cT3, cN3, pM1a) - Signed by Lloyd Huger, MD on 12/23/2016   Lloyd Huger, MD   03/05/2017 1:37 PM

## 2017-03-03 ENCOUNTER — Inpatient Hospital Stay: Payer: Medicare Other

## 2017-03-03 ENCOUNTER — Inpatient Hospital Stay (HOSPITAL_BASED_OUTPATIENT_CLINIC_OR_DEPARTMENT_OTHER): Payer: Medicare Other | Admitting: Oncology

## 2017-03-03 ENCOUNTER — Ambulatory Visit: Payer: Medicare Other

## 2017-03-03 ENCOUNTER — Ambulatory Visit
Admission: RE | Admit: 2017-03-03 | Discharge: 2017-03-03 | Disposition: A | Discharge status: Home / Self Care | Payer: Medicare Other | Source: Ambulatory Visit | Attending: Radiation Oncology | Admitting: Radiation Oncology

## 2017-03-03 VITALS — BP 118/75 | HR 76 | Temp 97.9°F | Resp 18 | Wt 152.6 lb

## 2017-03-03 DIAGNOSIS — Z79899 Other long term (current) drug therapy: Secondary | ICD-10-CM

## 2017-03-03 DIAGNOSIS — Z8701 Personal history of pneumonia (recurrent): Secondary | ICD-10-CM

## 2017-03-03 DIAGNOSIS — R634 Abnormal weight loss: Secondary | ICD-10-CM

## 2017-03-03 DIAGNOSIS — E785 Hyperlipidemia, unspecified: Secondary | ICD-10-CM

## 2017-03-03 DIAGNOSIS — R0602 Shortness of breath: Secondary | ICD-10-CM | POA: Diagnosis not present

## 2017-03-03 DIAGNOSIS — Z85828 Personal history of other malignant neoplasm of skin: Secondary | ICD-10-CM

## 2017-03-03 DIAGNOSIS — C3491 Malignant neoplasm of unspecified part of right bronchus or lung: Secondary | ICD-10-CM

## 2017-03-03 DIAGNOSIS — C679 Malignant neoplasm of bladder, unspecified: Secondary | ICD-10-CM | POA: Diagnosis not present

## 2017-03-03 DIAGNOSIS — Z87442 Personal history of urinary calculi: Secondary | ICD-10-CM

## 2017-03-03 DIAGNOSIS — R5383 Other fatigue: Secondary | ICD-10-CM | POA: Diagnosis not present

## 2017-03-03 DIAGNOSIS — R531 Weakness: Secondary | ICD-10-CM | POA: Diagnosis not present

## 2017-03-03 DIAGNOSIS — R109 Unspecified abdominal pain: Secondary | ICD-10-CM | POA: Diagnosis not present

## 2017-03-03 DIAGNOSIS — Z87891 Personal history of nicotine dependence: Secondary | ICD-10-CM

## 2017-03-03 DIAGNOSIS — J449 Chronic obstructive pulmonary disease, unspecified: Secondary | ICD-10-CM

## 2017-03-03 DIAGNOSIS — Z7984 Long term (current) use of oral hypoglycemic drugs: Secondary | ICD-10-CM

## 2017-03-03 DIAGNOSIS — I10 Essential (primary) hypertension: Secondary | ICD-10-CM

## 2017-03-03 DIAGNOSIS — E119 Type 2 diabetes mellitus without complications: Secondary | ICD-10-CM

## 2017-03-03 DIAGNOSIS — C3411 Malignant neoplasm of upper lobe, right bronchus or lung: Secondary | ICD-10-CM | POA: Diagnosis not present

## 2017-03-03 DIAGNOSIS — Z803 Family history of malignant neoplasm of breast: Secondary | ICD-10-CM

## 2017-03-03 DIAGNOSIS — G4730 Sleep apnea, unspecified: Secondary | ICD-10-CM

## 2017-03-03 DIAGNOSIS — R42 Dizziness and giddiness: Secondary | ICD-10-CM

## 2017-03-03 DIAGNOSIS — M129 Arthropathy, unspecified: Secondary | ICD-10-CM

## 2017-03-03 DIAGNOSIS — Z923 Personal history of irradiation: Secondary | ICD-10-CM

## 2017-03-03 LAB — COMPREHENSIVE METABOLIC PANEL
ALK PHOS: 81 U/L (ref 38–126)
ALT: 23 U/L (ref 17–63)
AST: 18 U/L (ref 15–41)
Albumin: 2.8 g/dL — ABNORMAL LOW (ref 3.5–5.0)
Anion gap: 7 (ref 5–15)
BILIRUBIN TOTAL: 0.3 mg/dL (ref 0.3–1.2)
BUN: 14 mg/dL (ref 6–20)
CALCIUM: 9.1 mg/dL (ref 8.9–10.3)
CHLORIDE: 100 mmol/L — AB (ref 101–111)
CO2: 32 mmol/L (ref 22–32)
CREATININE: 0.9 mg/dL (ref 0.61–1.24)
GFR calc Af Amer: >60 mL/min (ref >60)
Glucose, Bld: 152 mg/dL — ABNORMAL HIGH (ref 65–99)
Potassium: 3.5 mmol/L (ref 3.5–5.1)
Sodium: 139 mmol/L (ref 135–145)
TOTAL PROTEIN: 6.5 g/dL (ref 6.5–8.1)

## 2017-03-03 LAB — CBC WITH DIFFERENTIAL/PLATELET
BASOS PCT: 1 %
Basophils Absolute: 0 10*3/uL (ref 0–0.1)
EOS PCT: 3 %
Eosinophils Absolute: 0.1 10*3/uL (ref 0–0.7)
HCT: 25.6 % — ABNORMAL LOW (ref 40.0–52.0)
Hemoglobin: 8.6 g/dL — ABNORMAL LOW (ref 13.0–18.0)
Lymphocytes Relative: 44 %
Lymphs Abs: 1.1 10*3/uL (ref 1.0–3.6)
MCH: 28.5 pg (ref 26.0–34.0)
MCHC: 33.7 g/dL (ref 32.0–36.0)
MCV: 84.5 fL (ref 80.0–100.0)
Monocytes Absolute: 0.2 10*3/uL (ref 0.2–1.0)
Monocytes Relative: 8 %
Neutro Abs: 1.2 10*3/uL — ABNORMAL LOW (ref 1.4–6.5)
Neutrophils Relative %: 44 %
Platelets: 376 10*3/uL (ref 150–440)
RBC: 3.03 MIL/uL — AB (ref 4.40–5.90)
RDW: 25.4 % — ABNORMAL HIGH (ref 11.5–14.5)
WBC: 2.6 10*3/uL — AB (ref 3.8–10.6)

## 2017-03-03 MED ORDER — DIPHENHYDRAMINE HCL 50 MG/ML IJ SOLN
25.0000 mg | Freq: Once | INTRAMUSCULAR | Status: AC
  Administered 2017-03-03: 25 mg via INTRAVENOUS
  Filled 2017-03-03: qty 1

## 2017-03-03 MED ORDER — SODIUM CHLORIDE 0.9% FLUSH
10.0000 mL | INTRAVENOUS | Status: DC | PRN
  Administered 2017-03-03: 10 mL
  Filled 2017-03-03: qty 10

## 2017-03-03 MED ORDER — SODIUM CHLORIDE 0.9% FLUSH
10.0000 mL | Freq: Once | INTRAVENOUS | Status: DC
  Filled 2017-03-03: qty 10

## 2017-03-03 MED ORDER — HEPARIN SOD (PORK) LOCK FLUSH 100 UNIT/ML IV SOLN
500.0000 [IU] | Freq: Once | INTRAVENOUS | Status: AC | PRN
Start: 2017-03-03 — End: 2017-03-03
  Administered 2017-03-03: 500 [IU]
  Filled 2017-03-03: qty 5

## 2017-03-03 MED ORDER — SODIUM CHLORIDE 0.9 % IV SOLN
169.4000 mg | Freq: Once | INTRAVENOUS | Status: AC
  Administered 2017-03-03: 170 mg via INTRAVENOUS
  Filled 2017-03-03: qty 17

## 2017-03-03 MED ORDER — DEXAMETHASONE SODIUM PHOSPHATE 10 MG/ML IJ SOLN
10.0000 mg | Freq: Once | INTRAMUSCULAR | Status: AC
  Administered 2017-03-03: 10 mg via INTRAVENOUS
  Filled 2017-03-03: qty 1
  Filled 2017-03-03: qty 3

## 2017-03-03 MED ORDER — SODIUM CHLORIDE 0.9 % IV SOLN
Freq: Once | INTRAVENOUS | Status: AC
  Administered 2017-03-03: 10:00:00 via INTRAVENOUS
  Filled 2017-03-03: qty 1000

## 2017-03-03 MED ORDER — HEPARIN SOD (PORK) LOCK FLUSH 100 UNIT/ML IV SOLN: 500.0000 [IU] | Freq: Once | INTRAVENOUS | Status: DC

## 2017-03-03 MED ORDER — PALONOSETRON HCL INJECTION 0.25 MG/5ML
0.2500 mg | Freq: Once | INTRAVENOUS | Status: AC
  Administered 2017-03-03: 0.25 mg via INTRAVENOUS
  Filled 2017-03-03: qty 5

## 2017-03-03 MED ORDER — PACLITAXEL CHEMO INJECTION 300 MG/50ML
84.0000 mg | Freq: Once | INTRAVENOUS | Status: AC
  Administered 2017-03-03: 84 mg via INTRAVENOUS
  Filled 2017-03-03: qty 14

## 2017-03-03 MED ORDER — SODIUM CHLORIDE 0.9 % IV SOLN: 10.0000 mg | Freq: Once | INTRAVENOUS | Status: DC

## 2017-03-03 MED ORDER — FAMOTIDINE IN NACL 20-0.9 MG/50ML-% IV SOLN
20.0000 mg | Freq: Once | INTRAVENOUS | Status: AC
  Administered 2017-03-03: 20 mg via INTRAVENOUS
  Filled 2017-03-03: qty 50

## 2017-03-03 NOTE — Progress Notes (Signed)
Patient is here today for follow up he is doing well no complaints

## 2017-03-04 ENCOUNTER — Ambulatory Visit
Admission: RE | Admit: 2017-03-04 | Discharge: 2017-03-04 | Disposition: A | Discharge status: Home / Self Care | Payer: Medicare Other | Source: Ambulatory Visit | Attending: Radiation Oncology | Admitting: Radiation Oncology

## 2017-03-04 ENCOUNTER — Ambulatory Visit: Payer: Medicare Other

## 2017-03-04 DIAGNOSIS — C3411 Malignant neoplasm of upper lobe, right bronchus or lung: Secondary | ICD-10-CM | POA: Diagnosis not present

## 2017-03-05 ENCOUNTER — Ambulatory Visit: Payer: Medicare Other

## 2017-03-05 ENCOUNTER — Ambulatory Visit
Admission: RE | Admit: 2017-03-05 | Discharge: 2017-03-05 | Disposition: A | Discharge status: Home / Self Care | Payer: Medicare Other | Source: Ambulatory Visit | Attending: Radiation Oncology | Admitting: Radiation Oncology

## 2017-03-05 DIAGNOSIS — C3411 Malignant neoplasm of upper lobe, right bronchus or lung: Secondary | ICD-10-CM | POA: Diagnosis not present

## 2017-03-07 ENCOUNTER — Ambulatory Visit: Payer: Medicare Other

## 2017-03-07 NOTE — Progress Notes (Signed)
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  INTERVAL HISTORY: Patient returns to clinic today for further evaluation and reconsideration of cycle 7 of weekly carboplatinum and Taxol. He is tolerating his daily XRT boost well. He and continues to have chronic weakness and fatigue, but this is improved since last week. He has a decreased appetite. His pain is better controlled. He has no neurologic complaints. He denies any recent fevers or illnesses. He denies any shortness of breath, cough, or hemoptysis. He denies any nausea, vomiting, constipation, or diarrhea. He has no melena or hematochezia. He has no urinary complaints. Patient offers no further specific complaints.  REVIEW OF SYSTEMS:   Review of Systems  Constitutional: Positive for malaise/fatigue. Negative for fever and weight loss.  Respiratory: Negative.  Negative for cough and shortness of breath.   Cardiovascular: Negative for chest pain and leg swelling.  Gastrointestinal: Negative.  Negative for abdominal pain.  Genitourinary: Negative.  Negative for flank pain.  Musculoskeletal: Negative.   Neurological: Positive for weakness. Negative for dizziness and sensory change.  Psychiatric/Behavioral: The patient is nervous/anxious.     As per HPI. Otherwise, a complete review of systems is negative.  PAST MEDICAL HISTORY: Past Medical History:  Diagnosis Date  . Arthritis   . Basal cell carcinoma of skin   . Bladder cancer ([place])   . COPD (chronic obstructive pulmonary disease) ([place])   . Diabetes mellitus without complication ([place])   . Dyspnea    with exertion  . History of kidney stones   . Hyperlipidemia   . Hypertension   . Kidney stone   .  Pneumonia   . Sleep apnea   . Squamous cell skin cancer     PAST SURGICAL HISTORY: Past Surgical History:  Procedure Laterality Date  . BLADDER FULGURATION    . HERNIA REPAIR    . PORTA CATH INSERTION N/A 12/28/2016   Procedure: Glori Luis Cath Insertion;  Surgeon: Algernon Huxley, MD;  Location: [facility];  Service: Cardiovascular;  Laterality: N/A;    FAMILY HISTORY: Family History  Problem Relation Age of Onset  . Breast cancer Mother   . Stroke Father   . Diabetes Father   . Prostate cancer Neg Hx   . Bladder Cancer Neg Hx   . Kidney cancer Neg Hx     ADVANCED DIRECTIVES (Y/N):  N  HEALTH MAINTENANCE: Social History  Substance Use Topics  . Smoking status: Former Smoker    Years: 60.00    Quit date: 12/14/2013  . Smokeless tobacco: Current User    Types: Snuff  . Alcohol use No     Colonoscopy:  PAP:  Bone density:  Lipid panel:  No Known Allergies  Current Outpatient Prescriptions  Medication Sig Dispense Refill  . acetaminophen (TYLENOL) 500 MG tablet Take 500 mg by mouth every 6 (six) hours as needed for moderate pain or headache.    . albuterol (PROVENTIL HFA;VENTOLIN HFA) 108 (90 Base) MCG/ACT inhaler Inhale 1-2 puffs into the lungs every 6 (six) hours as needed for wheezing or shortness of breath.     Marland Kitchen albuterol (PROVENTIL) (2.5 MG/3ML) 0.083% nebulizer solution Take 2.5 mg by nebulization every 4 (four) hours as needed for wheezing or shortness of breath.     Marland Kitchen  aspirin 81 MG chewable tablet TAKE 1 TABLET (81 MG) BY MOUTH AT NIGHT    . Cholecalciferol (VITAMIN D) 2000 units CAPS Take 2,000 Units by mouth daily.    . colchicine 0.6 MG tablet Take 0.6 mg by mouth daily.    Marland Kitchen dexamethasone (DECADRON) 4 MG tablet Take 1 tablet (4 mg total) by mouth daily. 25 tablet 0  . diphenhydramine-acetaminophen (TYLENOL PM) 25-500 MG TABS tablet Take 1-2 tablets by mouth at bedtime as needed (sleep).    Marland Kitchen glimepiride (AMARYL) 2 MG tablet Take 2 mg by mouth daily.       Marland Kitchen lisinopril (PRINIVIL,ZESTRIL) 40 MG tablet Take 40 mg by mouth daily.    . metFORMIN (GLUCOPHAGE-XR) 500 MG 24 hr tablet Take 1,000 mg by mouth 2 (two) times daily.     . Omega-3 Fatty Acids (FISH OIL) 1200 MG CAPS Take 1,200 mg by mouth daily.    . ondansetron (ZOFRAN) 8 MG tablet     . oxyCODONE-acetaminophen (PERCOCET/ROXICET) 5-325 MG tablet Take 1 tablet by mouth every 4 (four) hours as needed for severe pain. 60 tablet 0  . OXYGEN Inhale 2 L into the lungs continuous.    . simvastatin (ZOCOR) 40 MG tablet Take 40 mg by mouth daily.     . SYMBICORT 160-4.5 MCG/ACT inhaler Inhale 2 puffs into the lungs 2 (two) times daily.    . tamsulosin (FLOMAX) 0.4 MG CAPS capsule TAKE 1 TABLET BY MOUTH EVERY NIGHT BEFORE BEDTIME    . tiotropium (SPIRIVA HANDIHALER) 18 MCG inhalation capsule Place 18 mcg into inhaler and inhale at bedtime.      No current facility-administered medications for this visit.    Facility-Administered Medications Ordered in Other Visits  Medication Dose Route Frequency Provider Last Rate Last Dose  . sodium chloride flush (NS) 0.9 % injection 10 mL  10 mL Intravenous PRN Lloyd Huger, MD   10 mL at 01/06/17 0830    OBJECTIVE: Vitals:   03/09/17 1000  BP: 108/63  Pulse: 72  Resp: 20  Temp: 98.4 F (36.9 C)     Body mass index is 25.18 kg/m.    ECOG FS:2 - Symptomatic, <50% confined to bed  General: Well-developed, well-nourished, no acute distress. Eyes: Pink conjunctiva, anicteric sclera. Lungs: Clear to auscultation bilaterally. Heart: Regular rate and rhythm. No rubs, murmurs, or gallops. Abdomen: Soft, nontender, nondistended. No organomegaly noted, normoactive bowel sounds. Musculoskeletal: No edema, cyanosis, or clubbing. Neuro: Alert, answering all questions appropriately. Cranial nerves grossly intact. Skin: No rashes or petechiae noted. Psych: Normal affect.   LAB RESULTS:  Lab Results  Component Value Date   NA 139 03/09/2017   K 3.8  03/09/2017   CL 99 (L) 03/09/2017   CO2 35 (H) 03/09/2017   GLUCOSE 129 (H) 03/09/2017   BUN 19 03/09/2017   CREATININE 0.91 03/09/2017   CALCIUM 9.0 03/09/2017   PROT 6.9 03/09/2017   ALBUMIN 3.2 (L) 03/09/2017   AST 16 03/09/2017   ALT 15 (L) 03/09/2017   ALKPHOS 81 03/09/2017   BILITOT 0.4 03/09/2017   GFRNONAA >60 03/09/2017   GFRAA >60 03/09/2017    Lab Results  Component Value Date   WBC 3.7 (L) 03/09/2017   NEUTROABS 2.0 03/09/2017   HGB 8.7 (L) 03/09/2017   HCT 26.1 (L) 03/09/2017   MCV 85.9 03/09/2017   PLT 499 (H) 03/09/2017     STUDIES: No results found.  ASSESSMENT: Stage IVa squamous cell carcinoma of the right upper lobe  lung, bladder cancer.  PLAN:    1. 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 MRI the brain on December 31, 2016 reviewed independently with multiple scattered lesions too small to determine whether they are metastatic disease or not. Will repeat MRI in 2-3 months. Continue daily XRT, completing his boost on Mar 12, 2017. Proceed with cycle 7 of weekly carboplatinum and Taxol. Patient is now finished his concurrent chemotherapy and XRT and we will proceed with immunotherapy given his stage IV disease. Patient will return to clinic in 6 weeks with restaging PET scan and to initiate cycle 1 of Keytruda. Foundation 1 testing has also been sent. 2. Flank pain: Likely secondary to lateral chest wall invasion of lung mass. Resolved. Continue oxycodone as needed.  3. Bladder cancer: Patient known to have high-grade noninvasive urothelial carcinoma. His treatment with carboplatinum may be of some benefit, but in the future can consider a chemotherapeutic regimen for his lung cancer that would also benefit bladder cancer such as cisplatin and gemcitabine.  4. Poor appetite: Improved.  5. Anemia: Improved with 1 unit of packed red blood cells recently.     Patient understands that He can call clinic at any time with any questions, concerns, or complaints.   Cancer Staging Primary lung squamous cell carcinoma, right [facility]) Staging form: Lung, AJCC 8th Edition - Clinical stage from 12/23/2016: Stage IVA (cT3, cN3, pM1a) - Signed by Lloyd Huger, MD on 12/23/2016   Lloyd Huger, MD   03/15/2017 6:16 PM

## 2017-03-08 ENCOUNTER — Ambulatory Visit: Payer: Medicare Other

## 2017-03-08 ENCOUNTER — Ambulatory Visit
Admission: RE | Admit: 2017-03-08 | Discharge: 2017-03-08 | Disposition: A | Discharge status: Home / Self Care | Payer: Medicare Other | Source: Ambulatory Visit | Attending: Radiation Oncology | Admitting: Radiation Oncology

## 2017-03-08 DIAGNOSIS — C3411 Malignant neoplasm of upper lobe, right bronchus or lung: Secondary | ICD-10-CM | POA: Diagnosis not present

## 2017-03-09 ENCOUNTER — Ambulatory Visit: Payer: Medicare Other

## 2017-03-09 ENCOUNTER — Inpatient Hospital Stay: Payer: Medicare Other

## 2017-03-09 ENCOUNTER — Inpatient Hospital Stay (HOSPITAL_BASED_OUTPATIENT_CLINIC_OR_DEPARTMENT_OTHER): Payer: Medicare Other | Admitting: Oncology

## 2017-03-09 VITALS — BP 135/70 | HR 74 | Resp 20

## 2017-03-09 VITALS — BP 108/63 | HR 72 | Temp 98.4°F | Resp 20 | Wt 151.3 lb

## 2017-03-09 DIAGNOSIS — R634 Abnormal weight loss: Secondary | ICD-10-CM

## 2017-03-09 DIAGNOSIS — C7800 Secondary malignant neoplasm of unspecified lung: Secondary | ICD-10-CM

## 2017-03-09 DIAGNOSIS — Z79899 Other long term (current) drug therapy: Secondary | ICD-10-CM

## 2017-03-09 DIAGNOSIS — C3491 Malignant neoplasm of unspecified part of right bronchus or lung: Secondary | ICD-10-CM

## 2017-03-09 DIAGNOSIS — J449 Chronic obstructive pulmonary disease, unspecified: Secondary | ICD-10-CM | POA: Diagnosis not present

## 2017-03-09 DIAGNOSIS — Z85828 Personal history of other malignant neoplasm of skin: Secondary | ICD-10-CM

## 2017-03-09 DIAGNOSIS — M129 Arthropathy, unspecified: Secondary | ICD-10-CM | POA: Diagnosis not present

## 2017-03-09 DIAGNOSIS — C3411 Malignant neoplasm of upper lobe, right bronchus or lung: Secondary | ICD-10-CM | POA: Diagnosis not present

## 2017-03-09 DIAGNOSIS — R5383 Other fatigue: Secondary | ICD-10-CM

## 2017-03-09 DIAGNOSIS — Z8701 Personal history of pneumonia (recurrent): Secondary | ICD-10-CM

## 2017-03-09 DIAGNOSIS — Z87891 Personal history of nicotine dependence: Secondary | ICD-10-CM

## 2017-03-09 DIAGNOSIS — C679 Malignant neoplasm of bladder, unspecified: Secondary | ICD-10-CM

## 2017-03-09 DIAGNOSIS — R42 Dizziness and giddiness: Secondary | ICD-10-CM

## 2017-03-09 DIAGNOSIS — G4730 Sleep apnea, unspecified: Secondary | ICD-10-CM

## 2017-03-09 DIAGNOSIS — R531 Weakness: Secondary | ICD-10-CM

## 2017-03-09 DIAGNOSIS — E119 Type 2 diabetes mellitus without complications: Secondary | ICD-10-CM

## 2017-03-09 DIAGNOSIS — D649 Anemia, unspecified: Secondary | ICD-10-CM

## 2017-03-09 DIAGNOSIS — I10 Essential (primary) hypertension: Secondary | ICD-10-CM

## 2017-03-09 DIAGNOSIS — C7951 Secondary malignant neoplasm of bone: Secondary | ICD-10-CM | POA: Diagnosis not present

## 2017-03-09 DIAGNOSIS — R109 Unspecified abdominal pain: Secondary | ICD-10-CM | POA: Diagnosis not present

## 2017-03-09 DIAGNOSIS — R0602 Shortness of breath: Secondary | ICD-10-CM

## 2017-03-09 DIAGNOSIS — Z923 Personal history of irradiation: Secondary | ICD-10-CM

## 2017-03-09 DIAGNOSIS — Z803 Family history of malignant neoplasm of breast: Secondary | ICD-10-CM

## 2017-03-09 DIAGNOSIS — E785 Hyperlipidemia, unspecified: Secondary | ICD-10-CM

## 2017-03-09 DIAGNOSIS — Z7984 Long term (current) use of oral hypoglycemic drugs: Secondary | ICD-10-CM

## 2017-03-09 DIAGNOSIS — Z87442 Personal history of urinary calculi: Secondary | ICD-10-CM

## 2017-03-09 LAB — CBC WITH DIFFERENTIAL/PLATELET
BASOS PCT: 1 %
Basophils Absolute: 0 10*3/uL (ref 0–0.1)
EOS PCT: 7 %
Eosinophils Absolute: 0.3 10*3/uL (ref 0–0.7)
HCT: 26.1 % — ABNORMAL LOW (ref 40.0–52.0)
Hemoglobin: 8.7 g/dL — ABNORMAL LOW (ref 13.0–18.0)
Lymphocytes Relative: 31 %
Lymphs Abs: 1.2 10*3/uL (ref 1.0–3.6)
MCH: 28.7 pg (ref 26.0–34.0)
MCHC: 33.4 g/dL (ref 32.0–36.0)
MCV: 85.9 fL (ref 80.0–100.0)
MONO ABS: 0.3 10*3/uL (ref 0.2–1.0)
MONOS PCT: 8 %
NEUTROS PCT: 53 %
Neutro Abs: 2 10*3/uL (ref 1.4–6.5)
PLATELETS: 499 10*3/uL — AB (ref 150–440)
RBC: 3.04 MIL/uL — ABNORMAL LOW (ref 4.40–5.90)
RDW: 26.2 % — ABNORMAL HIGH (ref 11.5–14.5)
WBC: 3.7 10*3/uL — ABNORMAL LOW (ref 3.8–10.6)

## 2017-03-09 LAB — COMPREHENSIVE METABOLIC PANEL
ALT: 15 U/L — ABNORMAL LOW (ref 17–63)
ANION GAP: 5 (ref 5–15)
AST: 16 U/L (ref 15–41)
Albumin: 3.2 g/dL — ABNORMAL LOW (ref 3.5–5.0)
Alkaline Phosphatase: 81 U/L (ref 38–126)
BUN: 19 mg/dL (ref 6–20)
CHLORIDE: 99 mmol/L — AB (ref 101–111)
CO2: 35 mmol/L — AB (ref 22–32)
Calcium: 9 mg/dL (ref 8.9–10.3)
Creatinine, Ser: 0.91 mg/dL (ref 0.61–1.24)
GFR calc non Af Amer: >60 mL/min (ref >60)
Glucose, Bld: 129 mg/dL — ABNORMAL HIGH (ref 65–99)
POTASSIUM: 3.8 mmol/L (ref 3.5–5.1)
SODIUM: 139 mmol/L (ref 135–145)
Total Bilirubin: 0.4 mg/dL (ref 0.3–1.2)
Total Protein: 6.9 g/dL (ref 6.5–8.1)

## 2017-03-09 MED ORDER — PALONOSETRON HCL INJECTION 0.25 MG/5ML
0.2500 mg | Freq: Once | INTRAVENOUS | Status: AC
  Administered 2017-03-09: 0.25 mg via INTRAVENOUS
  Filled 2017-03-09: qty 5

## 2017-03-09 MED ORDER — DEXAMETHASONE SODIUM PHOSPHATE 10 MG/ML IJ SOLN
10.0000 mg | Freq: Once | INTRAMUSCULAR | Status: AC
  Administered 2017-03-09: 10 mg via INTRAVENOUS
  Filled 2017-03-09: qty 1

## 2017-03-09 MED ORDER — SODIUM CHLORIDE 0.9 % IV SOLN
169.4000 mg | Freq: Once | INTRAVENOUS | Status: AC
  Administered 2017-03-09: 170 mg via INTRAVENOUS
  Filled 2017-03-09: qty 17

## 2017-03-09 MED ORDER — SODIUM CHLORIDE 0.9% FLUSH
10.0000 mL | INTRAVENOUS | Status: DC | PRN
  Administered 2017-03-09: 10 mL via INTRAVENOUS
  Filled 2017-03-09: qty 10

## 2017-03-09 MED ORDER — PACLITAXEL CHEMO INJECTION 300 MG/50ML: 80.0000 mg | Freq: Once | INTRAVENOUS | Status: DC

## 2017-03-09 MED ORDER — FAMOTIDINE IN NACL 20-0.9 MG/50ML-% IV SOLN
20.0000 mg | Freq: Once | INTRAVENOUS | Status: AC
  Administered 2017-03-09: 20 mg via INTRAVENOUS
  Filled 2017-03-09: qty 50

## 2017-03-09 MED ORDER — SODIUM CHLORIDE 0.9 % IV SOLN: 10.0000 mg | Freq: Once | INTRAVENOUS | Status: DC

## 2017-03-09 MED ORDER — PACLITAXEL CHEMO INJECTION 300 MG/50ML: 45.0000 mg/m2 | Freq: Once | INTRAVENOUS | Status: DC

## 2017-03-09 MED ORDER — HEPARIN SOD (PORK) LOCK FLUSH 100 UNIT/ML IV SOLN
500.0000 [IU] | Freq: Once | INTRAVENOUS | Status: AC
  Administered 2017-03-09: 500 [IU] via INTRAVENOUS
  Filled 2017-03-09: qty 5

## 2017-03-09 MED ORDER — DIPHENHYDRAMINE HCL 50 MG/ML IJ SOLN
25.0000 mg | Freq: Once | INTRAMUSCULAR | Status: AC
  Administered 2017-03-09: 25 mg via INTRAVENOUS
  Filled 2017-03-09: qty 1

## 2017-03-09 MED ORDER — SODIUM CHLORIDE 0.9 % IV SOLN
Freq: Once | INTRAVENOUS | Status: AC
  Administered 2017-03-09: 11:00:00 via INTRAVENOUS
  Filled 2017-03-09: qty 1000

## 2017-03-09 MED ORDER — PACLITAXEL CHEMO INJECTION 300 MG/50ML
84.0000 mg | Freq: Once | INTRAVENOUS | Status: AC
  Administered 2017-03-09: 84 mg via INTRAVENOUS
  Filled 2017-03-09: qty 14

## 2017-03-09 NOTE — Progress Notes (Signed)
Patient denies any concerns today.  

## 2017-03-10 ENCOUNTER — Ambulatory Visit: Payer: Medicare Other

## 2017-03-11 ENCOUNTER — Ambulatory Visit: Payer: Medicare Other

## 2017-03-12 ENCOUNTER — Ambulatory Visit: Payer: Medicare Other

## 2017-03-12 ENCOUNTER — Ambulatory Visit
Admission: RE | Admit: 2017-03-12 | Discharge: 2017-03-12 | Disposition: A | Discharge status: Home / Self Care | Payer: Medicare Other | Source: Ambulatory Visit | Attending: Radiation Oncology | Admitting: Radiation Oncology

## 2017-03-12 DIAGNOSIS — C3411 Malignant neoplasm of upper lobe, right bronchus or lung: Secondary | ICD-10-CM | POA: Diagnosis not present

## 2017-03-15 NOTE — Progress Notes (Signed)
DISCONTINUE ON PATHWAY REGIMEN - Non-Small Cell Lung     A cycle is every 21 days:     Paclitaxel      Carboplatin   **Always confirm dose/schedule in your pharmacy ordering system**    REASON: Continuation Of Treatment PRIOR TREATMENT: LOS281: Carboplatin + Paclitaxel q21 Days x 4 Cycles TREATMENT RESPONSE: Partial Response (PR)  START ON PATHWAY REGIMEN - Non-Small Cell Lung     A cycle is 21 days:     Pembrolizumab   **Always confirm dose/schedule in your pharmacy ordering system**    Patient Characteristics: Stage IV Metastatic, Squamous, PS = 0, 1, Second Line, No Prior PD-1/PD-L1  Inhibitor and Immunotherapy Candidate AJCC T Category: T3 Current Disease Status: Distant Metastases AJCC N Category: N3 AJCC M Category: M1a AJCC 8 Stage Grouping: IVA Histology: Squamous Cell Line of therapy: Second Line PD-L1 Expression Status: PD-L1 Positive 1-49% (TPS) Performance Status: PS = 0, 1 Would you be surprised if this patient died  in the next year? I would NOT be surprised if this patient died in the next year Immunotherapy Candidate Status: Candidate for Immunotherapy Prior Immunotherapy Status: No Prior PD-1/PD-L1 Inhibitor  Intent of Therapy: Non-Curative / Palliative Intent, Discussed with Patient

## 2017-03-16 ENCOUNTER — Ambulatory Visit
Admission: RE | Admit: 2017-03-16 | Discharge: 2017-03-16 | Disposition: A | Discharge status: Home / Self Care | Payer: Medicare Other | Source: Ambulatory Visit | Attending: Radiation Oncology | Admitting: Radiation Oncology

## 2017-03-16 ENCOUNTER — Ambulatory Visit: Payer: Medicare Other

## 2017-03-16 DIAGNOSIS — C3411 Malignant neoplasm of upper lobe, right bronchus or lung: Secondary | ICD-10-CM | POA: Diagnosis not present

## 2017-03-17 ENCOUNTER — Ambulatory Visit
Admission: RE | Admit: 2017-03-17 | Discharge: 2017-03-17 | Disposition: A | Discharge status: Home / Self Care | Payer: Medicare Other | Source: Ambulatory Visit | Attending: Radiation Oncology | Admitting: Radiation Oncology

## 2017-03-17 ENCOUNTER — Ambulatory Visit: Payer: Medicare Other

## 2017-03-17 DIAGNOSIS — C3411 Malignant neoplasm of upper lobe, right bronchus or lung: Secondary | ICD-10-CM | POA: Diagnosis not present

## 2017-03-18 ENCOUNTER — Ambulatory Visit
Admission: RE | Admit: 2017-03-18 | Discharge: 2017-03-18 | Disposition: A | Discharge status: Home / Self Care | Payer: Medicare Other | Source: Ambulatory Visit | Attending: Radiation Oncology | Admitting: Radiation Oncology

## 2017-03-18 ENCOUNTER — Ambulatory Visit: Payer: Medicare Other | Admitting: Radiation Oncology

## 2017-03-18 DIAGNOSIS — C3411 Malignant neoplasm of upper lobe, right bronchus or lung: Secondary | ICD-10-CM | POA: Diagnosis not present

## 2017-03-19 ENCOUNTER — Encounter: Payer: Self-pay | Admitting: Oncology

## 2017-03-26 ENCOUNTER — Encounter: Payer: Self-pay | Admitting: Oncology

## 2017-03-30 ENCOUNTER — Emergency Department: Payer: Medicare Other

## 2017-03-30 ENCOUNTER — Encounter: Payer: Self-pay | Admitting: Emergency Medicine

## 2017-03-30 ENCOUNTER — Emergency Department
Admission: EM | Admit: 2017-03-30 | Discharge: 2017-03-30 | Disposition: A | Discharge status: Other Acute Inpatient Hospital | Payer: Medicare Other | Attending: Emergency Medicine | Admitting: Emergency Medicine

## 2017-03-30 DIAGNOSIS — E119 Type 2 diabetes mellitus without complications: Secondary | ICD-10-CM | POA: Insufficient documentation

## 2017-03-30 DIAGNOSIS — R4182 Altered mental status, unspecified: Secondary | ICD-10-CM | POA: Diagnosis present

## 2017-03-30 DIAGNOSIS — I6200 Nontraumatic subdural hemorrhage, unspecified: Secondary | ICD-10-CM | POA: Insufficient documentation

## 2017-03-30 DIAGNOSIS — Z8551 Personal history of malignant neoplasm of bladder: Secondary | ICD-10-CM | POA: Diagnosis not present

## 2017-03-30 DIAGNOSIS — Z87891 Personal history of nicotine dependence: Secondary | ICD-10-CM | POA: Insufficient documentation

## 2017-03-30 DIAGNOSIS — I629 Nontraumatic intracranial hemorrhage, unspecified: Secondary | ICD-10-CM

## 2017-03-30 DIAGNOSIS — G936 Cerebral edema: Secondary | ICD-10-CM

## 2017-03-30 DIAGNOSIS — I10 Essential (primary) hypertension: Secondary | ICD-10-CM | POA: Insufficient documentation

## 2017-03-30 DIAGNOSIS — R9089 Other abnormal findings on diagnostic imaging of central nervous system: Secondary | ICD-10-CM

## 2017-03-30 DIAGNOSIS — Z85828 Personal history of other malignant neoplasm of skin: Secondary | ICD-10-CM | POA: Diagnosis not present

## 2017-03-30 DIAGNOSIS — G939 Disorder of brain, unspecified: Secondary | ICD-10-CM

## 2017-03-30 DIAGNOSIS — J449 Chronic obstructive pulmonary disease, unspecified: Secondary | ICD-10-CM | POA: Diagnosis not present

## 2017-03-30 HISTORY — DX: Malignant neoplasm of unspecified part of unspecified bronchus or lung: C34.90

## 2017-03-30 LAB — URINALYSIS, COMPLETE (UACMP) WITH MICROSCOPIC
BILIRUBIN URINE: NEGATIVE
Bacteria, UA: NONE SEEN
Glucose, UA: 50 mg/dL — AB
Hgb urine dipstick: NEGATIVE
Ketones, ur: NEGATIVE mg/dL
LEUKOCYTES UA: NEGATIVE
NITRITE: NEGATIVE
PH: 5 (ref 5.0–8.0)
Protein, ur: 100 mg/dL — AB
SQUAMOUS EPITHELIAL / LPF: NONE SEEN
Specific Gravity, Urine: 1.021 (ref 1.005–1.030)

## 2017-03-30 LAB — COMPREHENSIVE METABOLIC PANEL
ALT: 12 U/L — AB (ref 17–63)
AST: 16 U/L (ref 15–41)
Albumin: 3.6 g/dL (ref 3.5–5.0)
Alkaline Phosphatase: 79 U/L (ref 38–126)
Anion gap: 9 (ref 5–15)
BILIRUBIN TOTAL: 0.6 mg/dL (ref 0.3–1.2)
BUN: 21 mg/dL — AB (ref 6–20)
CO2: 30 mmol/L (ref 22–32)
CREATININE: 0.74 mg/dL (ref 0.61–1.24)
Calcium: 9.7 mg/dL (ref 8.9–10.3)
Chloride: 95 mmol/L — ABNORMAL LOW (ref 101–111)
GFR calc Af Amer: >60 mL/min (ref >60)
GLUCOSE: 226 mg/dL — AB (ref 65–99)
Potassium: 3.7 mmol/L (ref 3.5–5.1)
Sodium: 134 mmol/L — ABNORMAL LOW (ref 135–145)
TOTAL PROTEIN: 8.2 g/dL — AB (ref 6.5–8.1)

## 2017-03-30 LAB — CBC
HCT: 29 % — ABNORMAL LOW (ref 40.0–52.0)
Hemoglobin: 9.5 g/dL — ABNORMAL LOW (ref 13.0–18.0)
MCH: 29.9 pg (ref 26.0–34.0)
MCHC: 32.8 g/dL (ref 32.0–36.0)
MCV: 91.1 fL (ref 80.0–100.0)
PLATELETS: 358 10*3/uL (ref 150–440)
RBC: 3.19 MIL/uL — ABNORMAL LOW (ref 4.40–5.90)
RDW: 22.1 % — AB (ref 11.5–14.5)
WBC: 13.8 10*3/uL — AB (ref 3.8–10.6)

## 2017-03-30 LAB — TROPONIN I

## 2017-03-30 MED ORDER — DEXAMETHASONE SODIUM PHOSPHATE 10 MG/ML IJ SOLN
10.0000 mg | Freq: Once | INTRAMUSCULAR | Status: AC
  Administered 2017-03-30: 10 mg via INTRAVENOUS
  Filled 2017-03-30: qty 1

## 2017-03-30 MED ORDER — SODIUM CHLORIDE 0.9 % IV BOLUS (SEPSIS)
1000.0000 mL | Freq: Once | INTRAVENOUS | Status: AC
  Administered 2017-03-30: 1000 mL via INTRAVENOUS

## 2017-03-30 MED ORDER — SODIUM CHLORIDE 0.9 % IV BOLUS (SEPSIS): 1000.0000 mL | Freq: Once | INTRAVENOUS | Status: DC

## 2017-03-30 NOTE — ED Notes (Signed)
EMS has arrived to transport patient to [facility].

## 2017-03-30 NOTE — ED Provider Notes (Signed)
[name] Emergency Department Provider Note  ____________________________________________  Time seen: Approximately 11:26 AM  I have reviewed the triage vital signs and the nursing notes.   HISTORY  Chief Complaint Altered Mental Status and Weakness    HPI [name] [name] is a 76 y.o. male with a history of stage IV squamous cell cancer of the right upper lobe, bladder cancer, and known brain metastatic disease presenting with altered mental status. The patient is brought by his wife who states that he has been confused for the past week.  He is unable to perform basic tasks such as using the remote.  No trauma, changes in vision or speech, numbness tingling or weakness, headache. The patient has had some generalized weakness. No recent illness including fever or chills.  The patient underwent MR of the brain 3/18 with concern for metastatic foci at that time.   Past Medical History:  Diagnosis Date  . Arthritis   . Basal cell carcinoma of skin   . Bladder cancer ([place])   . COPD (chronic obstructive pulmonary disease) ([place])   . Diabetes mellitus without complication ([place])   . Dyspnea    with exertion  . History of kidney stones   . Hyperlipidemia   . Hypertension   . Kidney stone   . Lung cancer ([place])   . Pneumonia   . Sleep apnea   . Squamous cell skin cancer     Patient Active Problem List   Diagnosis Date Noted  . Iron deficiency anemia due to chronic blood loss 01/06/2017  . Goals of care, counseling/discussion 12/23/2016  . Primary lung squamous cell carcinoma, right ([place]) 11/29/2016  . COPD (chronic obstructive pulmonary disease) ([place]) 04/23/2014  . Bladder cancer ([place]) 03/28/2013    Past Surgical History:  Procedure Laterality Date  . BLADDER FULGURATION    . HERNIA REPAIR    . PORTA CATH INSERTION N/A 12/28/2016   Procedure: Glori Luis Cath Insertion;  Surgeon: Algernon Huxley, MD;  Location: [facility];  Service: Cardiovascular;   Laterality: N/A;    Current Outpatient Rx  . Order #: [id number] Class: Historical Med  . Order #: [id number] Class: Historical Med  . Order #: [id number] Class: Historical Med  . Order #: [id number] Class: Historical Med  . Order #: [id number] Class: Historical Med  . Order #: [id number] Class: Historical Med  . Order #: [id number] Class: Normal  . Order #: [id number] Class: Historical Med  . Order #: [id number] Class: Historical Med  . Order #: [id number] Class: Historical Med  . Order #: [id number] Class: Historical Med  . Order #: [id number] Class: Historical Med  . Order #: [id number] Class: Historical Med  . Order #: [id number] Class: Print  . Order #: [id number] Class: Historical Med  . Order #: [id number] Class: Historical Med  . Order #: [id number] Class: Historical Med  . Order #: [id number] Class: Historical Med  . Order #: [id number] Class: Historical Med    Allergies Patient has no known allergies.  Family History  Problem Relation Age of Onset  . Breast cancer Mother   . Stroke Father   . Diabetes Father   . Prostate cancer Neg Hx   . Bladder Cancer Neg Hx   . Kidney cancer Neg Hx     Social History Social History  Substance Use Topics  . Smoking status: Former Smoker    Years: 60.00    Quit date: 12/14/2013  . Smokeless tobacco: Current User    Types: Snuff  . Alcohol use No    Review of Systems Constitutional: No fever/chills.No lightheadedness or  syncope. Positive altered mental status. Positive generalized weakness. Eyes: No visual changes. No double vision or blurred vision. ENT: No sore throat. No congestion or rhinorrhea. Cardiovascular: Denies chest pain. Denies palpitations. Respiratory: Denies shortness of breath.  No cough. Gastrointestinal: No abdominal pain.  No nausea, no vomiting.  No diarrhea.  No constipation. Genitourinary: Negative for dysuria. Musculoskeletal: Negative for back pain. Skin: Negative for rash. Neurological: Negative for headaches. No focal  numbness, tingling or weakness. No difficulty with balance with walking.    ____________________________________________   PHYSICAL EXAM:  VITAL SIGNS: ED Triage Vitals  Enc Vitals Group     BP 03/30/17 1035 (!) 152/58     Pulse Rate 03/30/17 1035 95     Resp 03/30/17 1035 20     Temp 03/30/17 1035 98.5 F (36.9 C)     Temp Source 03/30/17 1035 Oral     SpO2 03/30/17 1035 95 %     Weight 03/30/17 1036 149 lb (67.6 kg)     Height 03/30/17 1036 5\' 5"  (1.651 m)     Head Circumference --      Peak Flow --      Pain Score 03/30/17 1113 0     Pain Loc --      Pain Edu? --      Excl. in [place]? --     Constitutional: The patient is alert and answers most questions appropriately. He does think it is 2098. The patient is chronically ill appearing but nontoxic.  Eyes: Conjunctivae are normal.  EOMI. No scleral icterus. Positive crust in the bilateral eyes. Head: Atraumatic. No raccoon eyes or Battle sign. Nose: No congestion/rhinnorhea. Mouth/Throat: Mucous membranes are dry.  Neck: No stridor.  Supple.  No JVD. No meningismus. Cardiovascular: Normal rate, regular rhythm. No murmurs, rubs or gallops.  Respiratory: Normal respiratory effort.  No accessory muscle use or retractions. Lungs CTAB.  No wheezes, rales or ronchi. Gastrointestinal: Soft, nontender and nondistended.  No guarding or rebound.  No peritoneal signs. Musculoskeletal: No LE edema. No ttp in the calves or palpable cords.  Negative Homan's sign. Neurologic: Alert and but disoriented, thinks it is 2098, does not know the month. Speech is clear. Face and smile symmetric. Tongue is midline. EOMI. PERRLA.No pronator drift. 5 out of 5 grip, biceps, triceps, hip flexors, plantar flexion and dorsiflexion. Normal sensation to light touch in the bilateral upper and lower extremities, and face.  Skin:  Skin is warm, dry and intact. No rash noted. Psychiatric: Mood and affect are normal.   ____________________________________________   LABS (all labs ordered are listed, but only abnormal results are displayed)  Labs Reviewed  COMPREHENSIVE METABOLIC PANEL - Abnormal; Notable for the following:       Result Value   Sodium 134 (*)    Chloride 95 (*)    Glucose, Bld 226 (*)    BUN 21 (*)    Total Protein 8.2 (*)    ALT 12 (*)    All other components within normal limits  CBC - Abnormal; Notable for the following:    WBC 13.8 (*)    RBC 3.19 (*)    Hemoglobin 9.5 (*)    HCT 29.0 (*)    RDW 22.1 (*)    All other components within normal limits  URINALYSIS, COMPLETE (UACMP) WITH MICROSCOPIC - Abnormal; Notable for the following:    Color, Urine YELLOW (*)    APPearance CLEAR (*)    Glucose, UA 50 (*)    Protein,  ur 100 (*)    All other components within normal limits  TROPONIN I  CBG MONITORING, ED   ____________________________________________  EKG  ED ECG REPORT I, Eula Listen, the attending physician, personally viewed and interpreted this ECG.   Date: 03/30/2017  EKG Time: 1048  Rate: 94  Rhythm: normal sinus rhythm  Axis: leftward  Intervals:none  ST&T Change: Right bundle branch block. No STEMI. This EKG is compared to 2/18, with new right bundle branch block.  ____________________________________________  RADIOLOGY  Ct Head Wo Contrast  Result Date: 03/30/2017 CLINICAL DATA:  Lung cancer, bladder cancer.  Altered mental status. EXAM: CT HEAD WITHOUT CONTRAST TECHNIQUE: Contiguous axial images were obtained from the base of the skull through the vertex without intravenous contrast. COMPARISON:  MRI 12/31/2013 FINDINGS: Brain: Large area of vasogenic white matter edema in the left frontal lobe entering the corpus callosum. There is mass-effect on the left frontal horn which is displaced. There are 9 mm midline shift to the right. Edema was not present in this area on the prior study and is most likely due to metastatic disease. 1 cm mildly  hyperintense lesion in the left corona radiata has progressed in the interval and is surrounded by edema. This most likely is hemorrhagic metastatic disease. Multiple small enhancing lesions were identified on the prior MRI consistent with metastatic disease which are difficult to see on CT without contrast. Vascular: Negative for hyperdense vessel Skull: No skull lesion identified Sinuses/Orbits: Negative Other: None IMPRESSION: Interval development of a large area of edema in the left frontal lobe. This is most consistent with vasogenic edema due to metastatic disease which has developed since the prior study. There is mass-effect and midline shift 9 mm toward the right. Hemorrhagic lesion left corona radiata compatible with metastatic disease with surrounding edema. Electronically Signed   By: Franchot Gallo M.D.   On: 03/30/2017 11:51    ____________________________________________   PROCEDURES  Procedure(s) performed: None  Procedures  Critical Care performed: Yes, see critical care note(s) ____________________________________________   INITIAL IMPRESSION / ASSESSMENT AND PLAN / ED COURSE  Pertinent labs & imaging results that were available during my care of the patient were reviewed by me and considered in my medical decision making (see chart for details).  76 y.o. male with a history of stage IV lung cancer metastatic to the brain presenting with altered mental status for a week and a CT scan which shows vasogenic edema, midline shift, and hemorrhagic foci. The patient is hemodynamically stable and has no focal neurologic deficits on examination except for his mental status. He has a GCS of 15 and is protecting his airway at this time. I will treat the patient with Decadron for his edema, and have talked to [facility] for transfer.  The patient is not anticoagulated, including aspirin.  ----------------------------------------- 12:18 PM on  03/30/2017 -----------------------------------------  The patient has been accepted to [facility] for transfer.  CRITICAL CARE Performed by: Eula Listen   Total critical care time: 45 minutes  Critical care time was exclusive of separately billable procedures and treating other patients.  Critical care was necessary to treat or prevent imminent or life-threatening deterioration.  Critical care was time spent personally by me on the following activities: development of treatment plan with patient and/or surrogate as well as nursing, discussions with consultants, evaluation of patient's response to treatment, examination of patient, obtaining history from patient or surrogate, ordering and performing treatments and interventions, ordering and review of laboratory studies, ordering and review  of radiographic studies, pulse oximetry and re-evaluation of patient's condition.   ____________________________________________  FINAL CLINICAL IMPRESSION(S) / ED DIAGNOSES  Final diagnoses:  Intracranial hemorrhage ([place])  Intracranial edema ([facility])  Midline shift of brain  Altered mental status, unspecified altered mental status type         NEW MEDICATIONS STARTED DURING THIS VISIT:  New Prescriptions   No medications on file      Eula Listen, MD 03/30/17 1219

## 2017-03-30 NOTE — ED Notes (Signed)
Patients left chest port accessed by this RN. 20 G x 1 inch needle used with y site. Patient tolerated well. Blood return noted. Sterile technique maintained.

## 2017-03-30 NOTE — ED Triage Notes (Signed)
Wife reports about a week long history of confusion.  Patient has history of CA, last radiation done in early May.  Scheduled for PET scan 04/20/2017.

## 2017-04-02 ENCOUNTER — Ambulatory Visit
Admission: RE | Admit: 2017-04-02 | Discharge: 2017-04-02 | Disposition: A | Discharge status: Home / Self Care | Payer: Medicare Other | Source: Ambulatory Visit | Attending: Radiation Oncology | Admitting: Radiation Oncology

## 2017-04-02 ENCOUNTER — Other Ambulatory Visit: Payer: Self-pay | Admitting: *Deleted

## 2017-04-02 ENCOUNTER — Encounter: Payer: Self-pay | Admitting: Radiation Oncology

## 2017-04-02 VITALS — BP 123/55 | HR 72 | Temp 97.5°F | Resp 22 | Wt 149.6 lb

## 2017-04-02 DIAGNOSIS — C3491 Malignant neoplasm of unspecified part of right bronchus or lung: Secondary | ICD-10-CM | POA: Insufficient documentation

## 2017-04-02 DIAGNOSIS — Z87891 Personal history of nicotine dependence: Secondary | ICD-10-CM | POA: Insufficient documentation

## 2017-04-02 DIAGNOSIS — Z7984 Long term (current) use of oral hypoglycemic drugs: Secondary | ICD-10-CM | POA: Insufficient documentation

## 2017-04-02 DIAGNOSIS — Z7982 Long term (current) use of aspirin: Secondary | ICD-10-CM | POA: Insufficient documentation

## 2017-04-02 DIAGNOSIS — Z510 Encounter for antineoplastic radiation therapy: Secondary | ICD-10-CM | POA: Insufficient documentation

## 2017-04-02 DIAGNOSIS — Z9221 Personal history of antineoplastic chemotherapy: Secondary | ICD-10-CM | POA: Diagnosis not present

## 2017-04-02 DIAGNOSIS — Z79899 Other long term (current) drug therapy: Secondary | ICD-10-CM | POA: Insufficient documentation

## 2017-04-02 DIAGNOSIS — C7931 Secondary malignant neoplasm of brain: Secondary | ICD-10-CM

## 2017-04-02 DIAGNOSIS — Z923 Personal history of irradiation: Secondary | ICD-10-CM | POA: Diagnosis not present

## 2017-04-02 NOTE — Progress Notes (Addendum)
We were asked to evaluate [name] [name] by Dr. Camila [name] to render an opinion regarding the role of radiation in the management of his brain metastases. The patient was seen in consultation.  DIAGNOSIS: Primary lung squamous cell carcinoma, right (HCC) cT3, cN3, pM1a stage IVA  TREATMENTS RENDERED: 1. Concurrent chemoradiation to 61.4Gy with weekly carbo/taxol (completed 03/18/17)  HPI: This is a 76 yo M who is well known to this clinic having just recently completed concurrent CRT for his newly diagnosed lung Ca (biopsy proven T1 vertebrae). He presented to the ED on 03/30/17 with confusion and AMS. CT head revealed findings consistent with metastatic disease with vasogenic edema in the left frontal lobe, mass-effect and midline shift. [place] MRI from March showed too small to characterize lesions worrysome for metastases. He was started on decadron and transferred to [facility]. A brain MRI from 03/30/17 demonstrated a 3x2.5x3.1 cm left frontal lobe mass and a 1cm left parietal lobe mass and additional smaller lesions in the left occipital lobe and right frontoparietal lobe. Given the progression since diagnosis in March and the multifocal nature of the brain disease he was felt not to be a surgical candidate and was discharged to home on steroid and with followup in our clinic.  MEDICATIONS: Current Outpatient Prescriptions on File Prior to Encounter  Medication Sig Dispense Refill  . acetaminophen (TYLENOL) 500 MG tablet Take 500 mg by mouth every 6 (six) hours as needed for moderate pain or headache.    . albuterol (PROVENTIL HFA;VENTOLIN HFA) 108 (90 Base) MCG/ACT inhaler Inhale 1-2 puffs into the lungs every 6 (six) hours as needed for wheezing or shortness of breath.     Marland Kitchen albuterol (PROVENTIL) (2.5 MG/3ML) 0.083% nebulizer solution Take 2.5 mg by nebulization every 4 (four) hours as needed for wheezing or shortness of breath.     Marland Kitchen aspirin 81 MG chewable tablet TAKE 1 TABLET (81 MG) BY MOUTH AT NIGHT    .  Cholecalciferol (VITAMIN D) 2000 units CAPS Take 2,000 Units by mouth daily.    . colchicine 0.6 MG tablet Take 0.6 mg by mouth daily.    . diphenhydramine-acetaminophen (TYLENOL PM) 25-500 MG TABS tablet Take 1-2 tablets by mouth at bedtime as needed (sleep).    Marland Kitchen glimepiride (AMARYL) 2 MG tablet Take 2 mg by mouth daily.     Marland Kitchen lisinopril (PRINIVIL,ZESTRIL) 40 MG tablet Take 40 mg by mouth daily.    . metFORMIN (GLUCOPHAGE-XR) 500 MG 24 hr tablet Take 1,000 mg by mouth 2 (two) times daily.     . Omega-3 Fatty Acids (FISH OIL) 1200 MG CAPS Take 1,200 mg by mouth daily.    . ondansetron (ZOFRAN) 8 MG tablet     . oxyCODONE-acetaminophen (PERCOCET/ROXICET) 5-325 MG tablet Take 1 tablet by mouth every 4 (four) hours as needed for severe pain. 60 tablet 0  . OXYGEN Inhale 2 L into the lungs continuous.    . simvastatin (ZOCOR) 40 MG tablet Take 40 mg by mouth daily.     . SYMBICORT 160-4.5 MCG/ACT inhaler Inhale 2 puffs into the lungs 2 (two) times daily.    . tamsulosin (FLOMAX) 0.4 MG CAPS capsule TAKE 1 TABLET BY MOUTH EVERY NIGHT BEFORE BEDTIME    . tiotropium (SPIRIVA HANDIHALER) 18 MCG inhalation capsule Place 18 mcg into inhaler and inhale at bedtime.     . [DISCONTINUED] prochlorperazine (COMPAZINE) 10 MG tablet Take 1 tablet (10 mg total) by mouth every 6 (six) hours as needed (Nausea or vomiting). [place]  tablet 2   Current Facility-Administered Medications on File Prior to Encounter  Medication Dose Route Frequency Provider Last Rate Last Dose  . sodium chloride flush (NS) 0.9 % injection 10 mL  10 mL Intravenous PRN [name] Huger, MD   10 mL at 01/06/17 0830  also includes dexamethasone 4 mg q 6 hrs  ALLERGIES: NKDA  ROS: other than noted above the remainder of a 10 system review was negative  EXAM: Vitals:   04/02/17 0902  BP: (!) 123/55  Pulse: 72  Resp: (!) 22  Temp: 97.5 F (36.4 C)  ECOG PS: 2 GEN: Awake, alert, NAD HEENT: face symmetric, MMM, tongue midline NEURO:  PERRLA, EOMI, CN 2-12 intact to direct exam. Finger to nose intact. RAM intact MSK: Strength 5/5 all extremities and axial groups.  SKIN: no rashes or desquamation PULM: breathing comfortably on 2L by  CV: RRR PSYCH: oriented x 3, affect appropriate  MEDICAL DECISION MAKING: IMAGING: I have personally reviewed CT head and report from MRI brain as described above.  PATHOLOGY: from 12/14/16 - SCC from right supraclavicular node, PDL1- 1%, Foundation Med: No reportable alterations, TMB 57mts/mb  ASSESSMENT: 76yo M with progressive brain metastases on decadron (427mq 6hrs)  I discussed with [name] [name] diagnosis and the recommendation for whole brain RT given the multifocal nature of his disease. We discussed the risks, benefits and side effects of treatment in addition to the alternative of hospice. He would like to proceed with treatment. We have him schedule for a simulation scan later today and anticipate starting RT next week.  We discussed a slow taper of decadron. Will decrease to 4 mg BID today. After day 1 of radiation will go to 2 mg BID and hold at that level until near the end of treatment when Dr. ChBaruch [name] determine a final taper. They have been counseled regarding return of neurologic symptoms and return of steroid to 4 mg Q6hrs.  All questions answered.  PLAN:  1. Simulation today - 30 Gy in 10 fractions to whole brain 2. Decrease decadron to 11m31mID now and 2 mg BID after first day of RT, maintain at that level if symptoms remain controlled.  3. PET/CT and medonc followup as scheduled  [name] DartingD PhD Radiation Oncology

## 2017-04-05 ENCOUNTER — Ambulatory Visit
Admission: RE | Admit: 2017-04-05 | Discharge: 2017-04-05 | Disposition: A | Discharge status: Home / Self Care | Payer: Medicare Other | Source: Ambulatory Visit | Attending: Radiation Oncology | Admitting: Radiation Oncology

## 2017-04-05 DIAGNOSIS — C7931 Secondary malignant neoplasm of brain: Secondary | ICD-10-CM | POA: Diagnosis not present

## 2017-04-06 ENCOUNTER — Ambulatory Visit
Admission: RE | Admit: 2017-04-06 | Discharge: 2017-04-06 | Disposition: A | Discharge status: Home / Self Care | Payer: Medicare Other | Source: Ambulatory Visit | Attending: Radiation Oncology | Admitting: Radiation Oncology

## 2017-04-06 DIAGNOSIS — C7931 Secondary malignant neoplasm of brain: Secondary | ICD-10-CM | POA: Diagnosis not present

## 2017-04-07 ENCOUNTER — Ambulatory Visit
Admission: RE | Admit: 2017-04-07 | Discharge: 2017-04-07 | Disposition: A | Discharge status: Home / Self Care | Payer: Medicare Other | Source: Ambulatory Visit | Attending: Radiation Oncology | Admitting: Radiation Oncology

## 2017-04-07 DIAGNOSIS — C7931 Secondary malignant neoplasm of brain: Secondary | ICD-10-CM | POA: Diagnosis not present

## 2017-04-08 ENCOUNTER — Ambulatory Visit
Admission: RE | Admit: 2017-04-08 | Discharge: 2017-04-08 | Disposition: A | Discharge status: Home / Self Care | Payer: Medicare Other | Source: Ambulatory Visit | Attending: Radiation Oncology | Admitting: Radiation Oncology

## 2017-04-08 DIAGNOSIS — C7931 Secondary malignant neoplasm of brain: Secondary | ICD-10-CM | POA: Diagnosis not present

## 2017-04-09 ENCOUNTER — Ambulatory Visit
Admission: RE | Admit: 2017-04-09 | Discharge: 2017-04-09 | Disposition: A | Discharge status: Home / Self Care | Payer: Medicare Other | Source: Ambulatory Visit | Attending: Radiation Oncology | Admitting: Radiation Oncology

## 2017-04-09 ENCOUNTER — Inpatient Hospital Stay: Payer: Medicare Other | Attending: Oncology

## 2017-04-09 DIAGNOSIS — C3411 Malignant neoplasm of upper lobe, right bronchus or lung: Secondary | ICD-10-CM | POA: Diagnosis not present

## 2017-04-09 DIAGNOSIS — C7931 Secondary malignant neoplasm of brain: Secondary | ICD-10-CM | POA: Diagnosis not present

## 2017-04-12 ENCOUNTER — Ambulatory Visit
Admission: RE | Admit: 2017-04-12 | Discharge: 2017-04-12 | Disposition: A | Discharge status: Home / Self Care | Payer: Medicare Other | Source: Ambulatory Visit | Attending: Radiation Oncology | Admitting: Radiation Oncology

## 2017-04-12 DIAGNOSIS — C7931 Secondary malignant neoplasm of brain: Secondary | ICD-10-CM | POA: Diagnosis not present

## 2017-04-13 ENCOUNTER — Ambulatory Visit
Admission: RE | Admit: 2017-04-13 | Discharge: 2017-04-13 | Disposition: A | Discharge status: Home / Self Care | Payer: Medicare Other | Source: Ambulatory Visit | Attending: Radiation Oncology | Admitting: Radiation Oncology

## 2017-04-13 DIAGNOSIS — C3411 Malignant neoplasm of upper lobe, right bronchus or lung: Secondary | ICD-10-CM | POA: Diagnosis not present

## 2017-04-14 ENCOUNTER — Ambulatory Visit
Admission: RE | Admit: 2017-04-14 | Discharge: 2017-04-14 | Disposition: A | Discharge status: Home / Self Care | Payer: Medicare Other | Source: Ambulatory Visit | Attending: Radiation Oncology | Admitting: Radiation Oncology

## 2017-04-14 DIAGNOSIS — C3411 Malignant neoplasm of upper lobe, right bronchus or lung: Secondary | ICD-10-CM | POA: Diagnosis not present

## 2017-04-15 ENCOUNTER — Ambulatory Visit
Admission: RE | Admit: 2017-04-15 | Discharge: 2017-04-15 | Disposition: A | Discharge status: Home / Self Care | Payer: Medicare Other | Source: Ambulatory Visit | Attending: Radiation Oncology | Admitting: Radiation Oncology

## 2017-04-15 DIAGNOSIS — C3411 Malignant neoplasm of upper lobe, right bronchus or lung: Secondary | ICD-10-CM | POA: Diagnosis not present

## 2017-04-16 ENCOUNTER — Ambulatory Visit
Admission: RE | Admit: 2017-04-16 | Discharge: 2017-04-16 | Disposition: A | Discharge status: Home / Self Care | Payer: Medicare Other | Source: Ambulatory Visit | Attending: Radiation Oncology | Admitting: Radiation Oncology

## 2017-04-16 DIAGNOSIS — C7931 Secondary malignant neoplasm of brain: Secondary | ICD-10-CM | POA: Diagnosis not present

## 2017-04-19 NOTE — Progress Notes (Signed)
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  INTERVAL HISTORY: Patient returns to clinic today for further evaluation and consideration of cycle 1 of Keytruda. In the interim, he was noted to have brain metastases and has received palliative XRT completing last week. He and continues to have chronic weakness and fatigue. He has pain and mild swelling in his right lower extremity. He has a decreased appetite. His pain is better controlled. He has no neurologic complaints. He denies any recent fevers or illnesses. He denies any shortness of breath, cough, or hemoptysis. He denies any nausea, vomiting, constipation, or diarrhea. He has no melena or hematochezia. He has no urinary complaints. Patient offers no further specific complaints.  REVIEW OF SYSTEMS:   Review of Systems  Constitutional: Positive for malaise/fatigue. Negative for fever and weight loss.  Respiratory: Negative.  Negative for cough and shortness of breath.   Cardiovascular: Negative for chest pain and leg swelling.  Gastrointestinal: Negative.  Negative for abdominal pain.  Genitourinary: Negative.  Negative for flank pain.  Musculoskeletal: Negative.   Neurological: Positive for weakness. Negative for dizziness and sensory change.  Psychiatric/Behavioral: The patient is nervous/anxious.     As per HPI. Otherwise, a complete review of systems is negative.  PAST MEDICAL HISTORY: Past Medical History:  Diagnosis Date  . Arthritis   . Basal cell carcinoma of skin   . Bladder cancer ([place])   . COPD (chronic obstructive pulmonary disease) ([place])   . Diabetes mellitus without complication ([place])   . Dyspnea    with exertion  . History of kidney stones   .  Hyperlipidemia   . Hypertension   . Kidney stone   . Lung cancer ([place])   . Pneumonia   . Sleep apnea   . Squamous cell skin cancer     PAST SURGICAL HISTORY: Past Surgical History:  Procedure Laterality Date  . BLADDER FULGURATION    . HERNIA REPAIR    . PORTA CATH INSERTION N/A 12/28/2016   Procedure: Glori Luis Cath Insertion;  Surgeon: Algernon Huxley, MD;  Location: [facility];  Service: Cardiovascular;  Laterality: N/A;    FAMILY HISTORY: Family History  Problem Relation Age of Onset  . Breast cancer Mother   . Stroke Father   . Diabetes Father   . Prostate cancer Neg Hx   . Bladder Cancer Neg Hx   . Kidney cancer Neg Hx     ADVANCED DIRECTIVES (Y/N):  N  HEALTH MAINTENANCE: Social History  Substance Use Topics  . Smoking status: Former Smoker    Years: 60.00    Quit date: 12/14/2013  . Smokeless tobacco: Current User    Types: Snuff  . Alcohol use No     Colonoscopy:  PAP:  Bone density:  Lipid panel:  No Known Allergies  Current Outpatient Prescriptions  Medication Sig Dispense Refill  . acetaminophen (TYLENOL) 500 MG tablet Take 500 mg by mouth every 6 (six) hours as needed for moderate pain or headache.    . albuterol (PROVENTIL HFA;VENTOLIN HFA) 108 (90 Base) MCG/ACT inhaler Inhale 1-2 puffs into the lungs every 6 (six) hours as needed for wheezing or shortness of breath.     Marland Kitchen albuterol (PROVENTIL) (2.5 MG/3ML) 0.083% nebulizer solution Take  2.5 mg by nebulization every 4 (four) hours as needed for wheezing or shortness of breath.     Marland Kitchen aspirin 81 MG chewable tablet TAKE 1 TABLET (81 MG) BY MOUTH AT NIGHT    . Cholecalciferol (VITAMIN D) 2000 units CAPS Take 2,000 Units by mouth daily.    . colchicine 0.6 MG tablet Take 0.6 mg by mouth daily.    . diphenhydramine-acetaminophen (TYLENOL PM) 25-500 MG TABS tablet Take 1-2 tablets by mouth at bedtime as needed (sleep).    Marland Kitchen glimepiride (AMARYL) 2 MG tablet Take 2 mg by mouth daily.     Marland Kitchen lisinopril  (PRINIVIL,ZESTRIL) 40 MG tablet Take 40 mg by mouth daily.    . metFORMIN (GLUCOPHAGE-XR) 500 MG 24 hr tablet Take 1,000 mg by mouth 2 (two) times daily.     . Omega-3 Fatty Acids (FISH OIL) 1200 MG CAPS Take 1,200 mg by mouth daily.    . ondansetron (ZOFRAN) 8 MG tablet     . oxyCODONE-acetaminophen (PERCOCET/ROXICET) 5-325 MG tablet Take 1 tablet by mouth every 4 (four) hours as needed for severe pain. 60 tablet 0  . OXYGEN Inhale 2 L into the lungs continuous.    . simvastatin (ZOCOR) 40 MG tablet Take 40 mg by mouth daily.     . SYMBICORT 160-4.5 MCG/ACT inhaler Inhale 2 puffs into the lungs 2 (two) times daily.    . tamsulosin (FLOMAX) 0.4 MG CAPS capsule TAKE 1 TABLET BY MOUTH EVERY NIGHT BEFORE BEDTIME    . tiotropium (SPIRIVA HANDIHALER) 18 MCG inhalation capsule Place 18 mcg into inhaler and inhale at bedtime.      No current facility-administered medications for this visit.    Facility-Administered Medications Ordered in Other Visits  Medication Dose Route Frequency Provider Last Rate Last Dose  . sodium chloride flush (NS) 0.9 % injection 10 mL  10 mL Intravenous PRN Lloyd Huger, MD   10 mL at 01/06/17 0830    OBJECTIVE: Vitals:   04/22/17 1129  BP: (!) 101/54  Pulse: 81  Resp: 20  Temp: 97.7 F (36.5 C)     Body mass index is 24.55 kg/m.    ECOG FS:2 - Symptomatic, <50% confined to bed  General: Well-developed, well-nourished, no acute distress. Eyes: Pink conjunctiva, anicteric sclera. Lungs: Clear to auscultation bilaterally. Heart: Regular rate and rhythm. No rubs, murmurs, or gallops. Abdomen: Soft, nontender, nondistended. No organomegaly noted, normoactive bowel sounds. Musculoskeletal: No edema, cyanosis, or clubbing. Neuro: Alert, answering all questions appropriately. Cranial nerves grossly intact. Skin: No rashes or petechiae noted. Psych: Normal affect.   LAB RESULTS:  Lab Results  Component Value Date   NA 136 04/22/2017   K 4.6 04/22/2017     CL 99 (L) 04/22/2017   CO2 28 04/22/2017   GLUCOSE 280 (H) 04/22/2017   BUN 42 (H) 04/22/2017   CREATININE 0.91 04/22/2017   CALCIUM 9.3 04/22/2017   PROT 6.7 04/22/2017   ALBUMIN 2.8 (L) 04/22/2017   AST 18 04/22/2017   ALT 33 04/22/2017   ALKPHOS 77 04/22/2017   BILITOT 0.2 (L) 04/22/2017   GFRNONAA >60 04/22/2017   GFRAA >60 04/22/2017    Lab Results  Component Value Date   WBC 7.8 04/22/2017   NEUTROABS 7.0 (H) 04/22/2017   HGB 9.5 (L) 04/22/2017   HCT 27.7 (L) 04/22/2017   MCV 94.0 04/22/2017   PLT 364 04/22/2017     STUDIES: Ct Head Wo Contrast  Result Date: 03/30/2017 CLINICAL DATA:  Lung cancer,  bladder cancer.  Altered mental status. EXAM: CT HEAD WITHOUT CONTRAST TECHNIQUE: Contiguous axial images were obtained from the base of the skull through the vertex without intravenous contrast. COMPARISON:  MRI 12/31/2013 FINDINGS: Brain: Large area of vasogenic white matter edema in the left frontal lobe entering the corpus callosum. There is mass-effect on the left frontal horn which is displaced. There are 9 mm midline shift to the right. Edema was not present in this area on the prior study and is most likely due to metastatic disease. 1 cm mildly hyperintense lesion in the left corona radiata has progressed in the interval and is surrounded by edema. This most likely is hemorrhagic metastatic disease. Multiple small enhancing lesions were identified on the prior MRI consistent with metastatic disease which are difficult to see on CT without contrast. Vascular: Negative for hyperdense vessel Skull: No skull lesion identified Sinuses/Orbits: Negative Other: None IMPRESSION: Interval development of a large area of edema in the left frontal lobe. This is most consistent with vasogenic edema due to metastatic disease which has developed since the prior study. There is mass-effect and midline shift 9 mm toward the right. Hemorrhagic lesion left corona radiata compatible with  metastatic disease with surrounding edema. Electronically Signed   By: Franchot Gallo M.D.   On: 03/30/2017 11:51   Nm Pet Image Restag (ps) Skull Base To Thigh  Result Date: 04/20/2017 CLINICAL DATA:  Subsequent treatment strategy for right-sided lung cancer. EXAM: NUCLEAR MEDICINE PET SKULL BASE TO THIGH TECHNIQUE: 12.1 mCi F-18 FDG was injected intravenously. Full-ring PET imaging was performed from the skull base to thigh after the radiotracer. CT data was obtained and used for attenuation correction and anatomic localization. FASTING BLOOD GLUCOSE:  Value: 145 mg/dl COMPARISON:  12/07/2016 FINDINGS: NECK No hypermetabolic lymph nodes in the neck. CHEST A new tiny right supraclavicular lymph node is 7 mm short axis is hypermetabolic with SUV max = 3.1. The dominant right upper lobe lesion seen on the previous study has decreased substantially in the interval with a 4.0 cm cavitary lesion now visible where a 6.4 more confluent lesion invading the chest wall was seen on the prior study. This residual tumor remains hypermetabolic with SUV max = 3.1. Since the prior study, the patient has developed small pericardial nodules which are hypermetabolic (see image 96 series 3. FDG uptake in these nodules is consistent with SUV max = 4-5. 10 mm lingular nodule seen on the previous study is now 12 mm and hypermetabolic with SUV max = 4.4. A 13 mm right chest wall nodule seen just deep to the latissimus dorsi muscle on image 126 of series 3 demonstrates SUV max = 11 and is similar to prior. ABDOMEN/PELVIS A new 4.7 cm mass in the right liver (image 139 series 3) is hypermetabolic with SUV max = 16. Other tiny hypermetabolic liver lesions are evident. Interval development of mesenteric hypermetabolic lymphadenopathy. Central 10 mm mesenteric lymph node seen image 178 series 3 demonstrates SUV max = 17. There are multiple areas of hypermetabolism associated with stomach and small bowel. Hypermetabolic area of eccentric  wall thickening in small bowel is well demonstrated on image 184 series 3. There is an area of hypermetabolic wall thickening in the transverse duodenum seen on image 171. Focal hypermetabolism is seen in the lateral wall of the stomach (image 119 series 3). SKELETON Subtle lucent lesion in the left iliac bone (image 204 series 3) is hypermetabolic with SUV max = 4. Intramuscular lesions identified in the right  hamstring musculature (images 298 and 306 of series 3) are hypermetabolic with SUV max = 13. IMPRESSION: 1. Interval mixed response to therapy. The dominant right upper lobe lesion has decreased substantially in the interval but the patient has developed new hypermetabolic metastases in the right thoracic inlet, pericardium, liver, mesenteric lymph nodes, gastric/small bowel wall, left iliac bone, and posterior musculature of the right thigh. The right chest wall hypermetabolic lesion is similar to prior, the lingular nodule has progressed slightly in the interval and remains hypermetabolic and the left gluteal lesion seen on the prior study has resolved. Electronically Signed   By: Misty Stanley M.D.   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  PLAN:    1. 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 Patient previously had several lesions too small to characterize on MRI, but repeat MRI showed definitive metastatic disease in his brain and he has now completed palliative XRT. Proceed with cycle 1 of Keytruda today. Foundation 1 testing has also been sent. Return to clinic in 3 weeks for consideration of cycle 2. 2. Flank pain: Likely secondary to lateral chest wall invasion of lung mass. Resolved. Continue oxycodone as needed.  3. Bladder cancer: Patient known to have high-grade  noninvasive urothelial carcinoma. Treatments for his lung cancer may offer benefit to his bladder cancer. 4. Poor appetite: Improved.  5. Anemia: Improved with 1 unit of packed red blood cells recently.  6. Right lower extremity swelling and pain: We will get an ultrasound to assess for underlying DVT.  Patient understands that He can call clinic at any time with any questions, concerns, or complaints.   Cancer Staging Primary lung squamous cell carcinoma, right [facility]) Staging form: Lung, AJCC 8th Edition - Clinical stage from 12/23/2016: Stage IVA (cT3, cN3, pM1a) - Signed by Lloyd Huger, MD on 12/23/2016   Lloyd Huger, MD   04/22/2017 4:48 PM

## 2017-04-20 ENCOUNTER — Ambulatory Visit
Admission: RE | Admit: 2017-04-20 | Discharge: 2017-04-20 | Disposition: A | Discharge status: Home / Self Care | Payer: Medicare Other | Source: Ambulatory Visit | Attending: Oncology | Admitting: Oncology

## 2017-04-20 DIAGNOSIS — Z9889 Other specified postprocedural states: Secondary | ICD-10-CM | POA: Insufficient documentation

## 2017-04-20 DIAGNOSIS — R938 Abnormal findings on diagnostic imaging of other specified body structures: Secondary | ICD-10-CM | POA: Diagnosis not present

## 2017-04-20 DIAGNOSIS — C3491 Malignant neoplasm of unspecified part of right bronchus or lung: Secondary | ICD-10-CM | POA: Diagnosis present

## 2017-04-20 DIAGNOSIS — C7951 Secondary malignant neoplasm of bone: Secondary | ICD-10-CM | POA: Diagnosis not present

## 2017-04-20 DIAGNOSIS — C679 Malignant neoplasm of bladder, unspecified: Secondary | ICD-10-CM | POA: Diagnosis present

## 2017-04-20 DIAGNOSIS — C787 Secondary malignant neoplasm of liver and intrahepatic bile duct: Secondary | ICD-10-CM | POA: Diagnosis not present

## 2017-04-20 LAB — GLUCOSE, CAPILLARY: GLUCOSE-CAPILLARY: 145 mg/dL — AB (ref 65–99)

## 2017-04-20 MED ORDER — FLUDEOXYGLUCOSE F - 18 (FDG) INJECTION
12.0000 | Freq: Once | INTRAVENOUS | Status: AC | PRN
  Administered 2017-04-20: 12.11 via INTRAVENOUS

## 2017-04-22 ENCOUNTER — Inpatient Hospital Stay (HOSPITAL_BASED_OUTPATIENT_CLINIC_OR_DEPARTMENT_OTHER): Payer: Medicare Other | Admitting: Oncology

## 2017-04-22 ENCOUNTER — Inpatient Hospital Stay: Payer: Medicare Other | Attending: Oncology

## 2017-04-22 ENCOUNTER — Telehealth: Payer: Self-pay | Admitting: *Deleted

## 2017-04-22 ENCOUNTER — Ambulatory Visit: Admission: RE | Admit: 2017-04-22 | Payer: Medicare Other | Source: Ambulatory Visit | Admitting: Radiation Oncology

## 2017-04-22 ENCOUNTER — Inpatient Hospital Stay: Payer: Medicare Other

## 2017-04-22 ENCOUNTER — Ambulatory Visit
Admission: RE | Admit: 2017-04-22 | Discharge: 2017-04-22 | Disposition: A | Discharge status: Home / Self Care | Payer: Medicare Other | Source: Ambulatory Visit | Attending: Oncology | Admitting: Oncology

## 2017-04-22 VITALS — BP 101/54 | HR 81 | Temp 97.7°F | Resp 20 | Wt 147.5 lb

## 2017-04-22 DIAGNOSIS — Z87891 Personal history of nicotine dependence: Secondary | ICD-10-CM | POA: Diagnosis not present

## 2017-04-22 DIAGNOSIS — R5383 Other fatigue: Secondary | ICD-10-CM | POA: Insufficient documentation

## 2017-04-22 DIAGNOSIS — C7931 Secondary malignant neoplasm of brain: Secondary | ICD-10-CM

## 2017-04-22 DIAGNOSIS — E119 Type 2 diabetes mellitus without complications: Secondary | ICD-10-CM

## 2017-04-22 DIAGNOSIS — M79661 Pain in right lower leg: Secondary | ICD-10-CM | POA: Diagnosis not present

## 2017-04-22 DIAGNOSIS — E785 Hyperlipidemia, unspecified: Secondary | ICD-10-CM | POA: Insufficient documentation

## 2017-04-22 DIAGNOSIS — Z79899 Other long term (current) drug therapy: Secondary | ICD-10-CM

## 2017-04-22 DIAGNOSIS — I10 Essential (primary) hypertension: Secondary | ICD-10-CM

## 2017-04-22 DIAGNOSIS — Z803 Family history of malignant neoplasm of breast: Secondary | ICD-10-CM

## 2017-04-22 DIAGNOSIS — J449 Chronic obstructive pulmonary disease, unspecified: Secondary | ICD-10-CM | POA: Diagnosis not present

## 2017-04-22 DIAGNOSIS — C679 Malignant neoplasm of bladder, unspecified: Secondary | ICD-10-CM

## 2017-04-22 DIAGNOSIS — D649 Anemia, unspecified: Secondary | ICD-10-CM | POA: Insufficient documentation

## 2017-04-22 DIAGNOSIS — M7989 Other specified soft tissue disorders: Secondary | ICD-10-CM

## 2017-04-22 DIAGNOSIS — Z7982 Long term (current) use of aspirin: Secondary | ICD-10-CM | POA: Insufficient documentation

## 2017-04-22 DIAGNOSIS — R531 Weakness: Secondary | ICD-10-CM

## 2017-04-22 DIAGNOSIS — Z7984 Long term (current) use of oral hypoglycemic drugs: Secondary | ICD-10-CM | POA: Diagnosis not present

## 2017-04-22 DIAGNOSIS — C3411 Malignant neoplasm of upper lobe, right bronchus or lung: Secondary | ICD-10-CM

## 2017-04-22 DIAGNOSIS — Z87442 Personal history of urinary calculi: Secondary | ICD-10-CM | POA: Insufficient documentation

## 2017-04-22 DIAGNOSIS — G4730 Sleep apnea, unspecified: Secondary | ICD-10-CM | POA: Insufficient documentation

## 2017-04-22 DIAGNOSIS — C3491 Malignant neoplasm of unspecified part of right bronchus or lung: Secondary | ICD-10-CM

## 2017-04-22 DIAGNOSIS — R109 Unspecified abdominal pain: Secondary | ICD-10-CM | POA: Insufficient documentation

## 2017-04-22 DIAGNOSIS — R630 Anorexia: Secondary | ICD-10-CM

## 2017-04-22 DIAGNOSIS — Z85828 Personal history of other malignant neoplasm of skin: Secondary | ICD-10-CM

## 2017-04-22 DIAGNOSIS — Z5112 Encounter for antineoplastic immunotherapy: Secondary | ICD-10-CM | POA: Insufficient documentation

## 2017-04-22 LAB — COMPREHENSIVE METABOLIC PANEL
ALT: 33 U/L (ref 17–63)
AST: 18 U/L (ref 15–41)
Albumin: 2.8 g/dL — ABNORMAL LOW (ref 3.5–5.0)
Alkaline Phosphatase: 77 U/L (ref 38–126)
Anion gap: 9 (ref 5–15)
BILIRUBIN TOTAL: 0.2 mg/dL — AB (ref 0.3–1.2)
BUN: 42 mg/dL — AB (ref 6–20)
CHLORIDE: 99 mmol/L — AB (ref 101–111)
CO2: 28 mmol/L (ref 22–32)
CREATININE: 0.91 mg/dL (ref 0.61–1.24)
Calcium: 9.3 mg/dL (ref 8.9–10.3)
Glucose, Bld: 280 mg/dL — ABNORMAL HIGH (ref 65–99)
Potassium: 4.6 mmol/L (ref 3.5–5.1)
Sodium: 136 mmol/L (ref 135–145)
TOTAL PROTEIN: 6.7 g/dL (ref 6.5–8.1)

## 2017-04-22 LAB — CBC WITH DIFFERENTIAL/PLATELET
Basophils Absolute: 0 10*3/uL (ref 0–0.1)
Basophils Relative: 0 %
EOS PCT: 0 %
Eosinophils Absolute: 0 10*3/uL (ref 0–0.7)
HEMATOCRIT: 27.7 % — AB (ref 40.0–52.0)
Hemoglobin: 9.5 g/dL — ABNORMAL LOW (ref 13.0–18.0)
LYMPHS ABS: 0.5 10*3/uL — AB (ref 1.0–3.6)
LYMPHS PCT: 6 %
MCH: 32 pg (ref 26.0–34.0)
MCHC: 34.1 g/dL (ref 32.0–36.0)
MCV: 94 fL (ref 80.0–100.0)
Monocytes Absolute: 0.3 10*3/uL (ref 0.2–1.0)
Monocytes Relative: 4 %
NEUTROS ABS: 7 10*3/uL — AB (ref 1.4–6.5)
Neutrophils Relative %: 90 %
PLATELETS: 364 10*3/uL (ref 150–440)
RBC: 2.95 MIL/uL — AB (ref 4.40–5.90)
RDW: 17.9 % — AB (ref 11.5–14.5)
WBC: 7.8 10*3/uL (ref 3.8–10.6)

## 2017-04-22 MED ORDER — SODIUM CHLORIDE 0.9 % IV SOLN
200.0000 mg | Freq: Once | INTRAVENOUS | Status: AC
  Administered 2017-04-22: 200 mg via INTRAVENOUS
  Filled 2017-04-22: qty 8

## 2017-04-22 MED ORDER — SODIUM CHLORIDE 0.9 % IV SOLN
Freq: Once | INTRAVENOUS | Status: AC
  Administered 2017-04-22: 12:00:00 via INTRAVENOUS
  Filled 2017-04-22: qty 1000

## 2017-04-22 MED ORDER — HEPARIN SOD (PORK) LOCK FLUSH 100 UNIT/ML IV SOLN
500.0000 [IU] | Freq: Once | INTRAVENOUS | Status: AC | PRN
  Administered 2017-04-22: 500 [IU]

## 2017-04-22 NOTE — Progress Notes (Signed)
Patient reports weakness today, pain to right leg.

## 2017-04-22 NOTE — Telephone Encounter (Signed)
Entered in error

## 2017-04-23 ENCOUNTER — Telehealth: Payer: Self-pay | Admitting: *Deleted

## 2017-04-23 LAB — THYROID PANEL WITH TSH
Free Thyroxine Index: 1.3 (ref 1.2–4.9)
T3 Uptake Ratio: 27 % (ref 24–39)
T4 TOTAL: 4.8 ug/dL (ref 4.5–12.0)
TSH: 0.902 u[IU]/mL (ref 0.450–4.500)

## 2017-04-23 NOTE — Telephone Encounter (Signed)
Wife informed of results by leaving message on VM

## 2017-04-23 NOTE — Telephone Encounter (Signed)
Normal. No DVT or other abnormality reported

## 2017-04-23 NOTE — Telephone Encounter (Signed)
Hassan Rowan (wife) calling to get results of [place] from yesterday. Please return her call [phone]

## 2017-04-26 ENCOUNTER — Telehealth: Payer: Self-pay | Admitting: *Deleted

## 2017-04-26 NOTE — Telephone Encounter (Signed)
He received Keytruda last week. Hospice would be appropriate if that is what patient wishes.

## 2017-04-26 NOTE — Telephone Encounter (Signed)
Orders faxed

## 2017-04-26 NOTE — Telephone Encounter (Signed)
Pain in right side and legs bil.  Very weak and wants help and is asking for Hospice services. Please advise.

## 2017-04-30 ENCOUNTER — Encounter: Payer: Self-pay | Admitting: Oncology

## 2017-05-13 ENCOUNTER — Inpatient Hospital Stay: Payer: Medicare Other

## 2017-05-13 ENCOUNTER — Inpatient Hospital Stay: Payer: Medicare Other | Admitting: Oncology

## 2017-05-19 DEATH — deceased

## 2017-05-28 ENCOUNTER — Ambulatory Visit: Payer: Medicare Other | Admitting: Radiation Oncology

## 2017-06-02 ENCOUNTER — Other Ambulatory Visit: Payer: Self-pay | Admitting: Nurse Practitioner

## 2018-07-15 IMAGING — US US BIOPSY
1 series · 9 of 9 positions shown · non-contrast
Comparison: PET scan on 12/07/2016

CLINICAL DATA: Large right upper lobe lung mass with abnormal right
supraclavicular lymph nodes by PET scan. The patient presents for
ultrasound-guided biopsy of one of the supraclavicular lymph nodes.

EXAM:
ULTRASOUND GUIDED CORE BIOPSY OF RIGHT SUPRACLAVICULAR LYMPH NODE

[Series 1: us biopsy · 0.05mm/px · 9 of 9 slices shown]
[im 1/9]
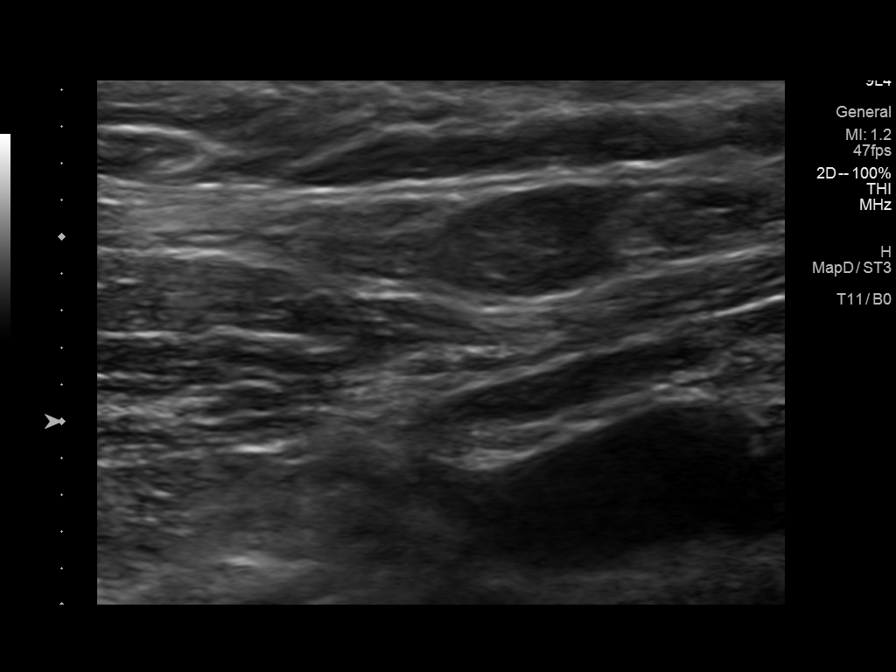
[im 2/9]
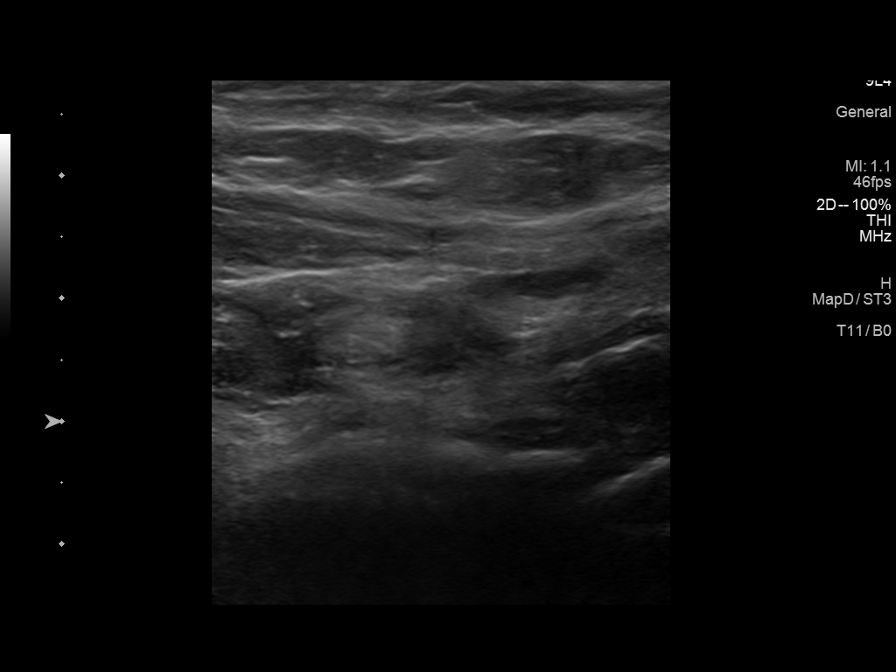
[im 3/9]
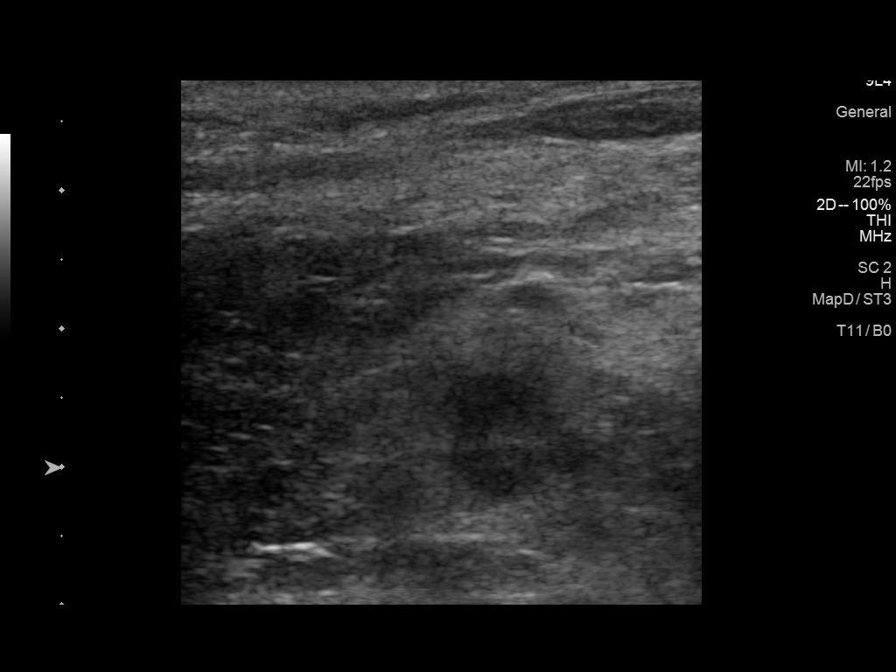
[im 4/9]
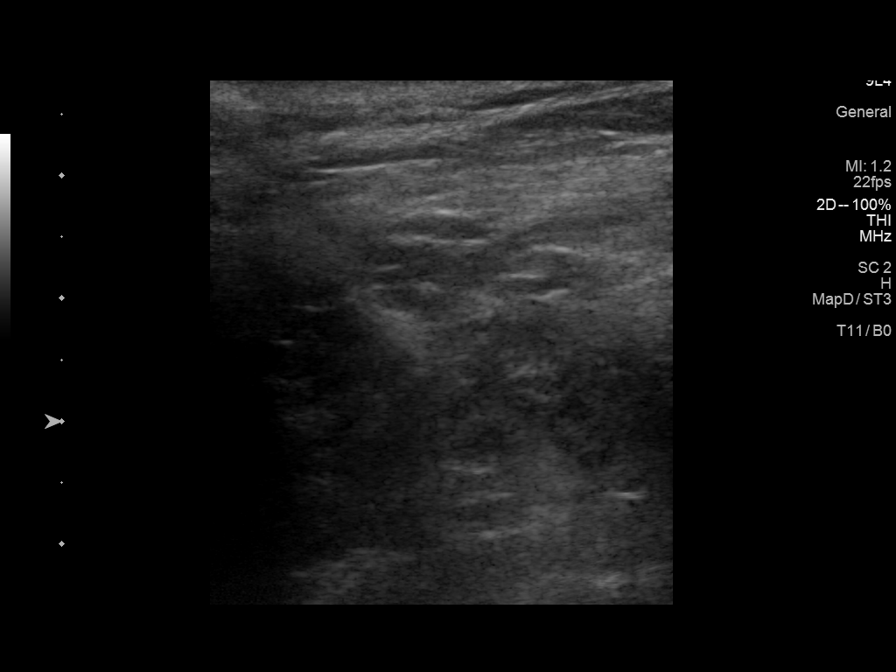
[im 5/9]
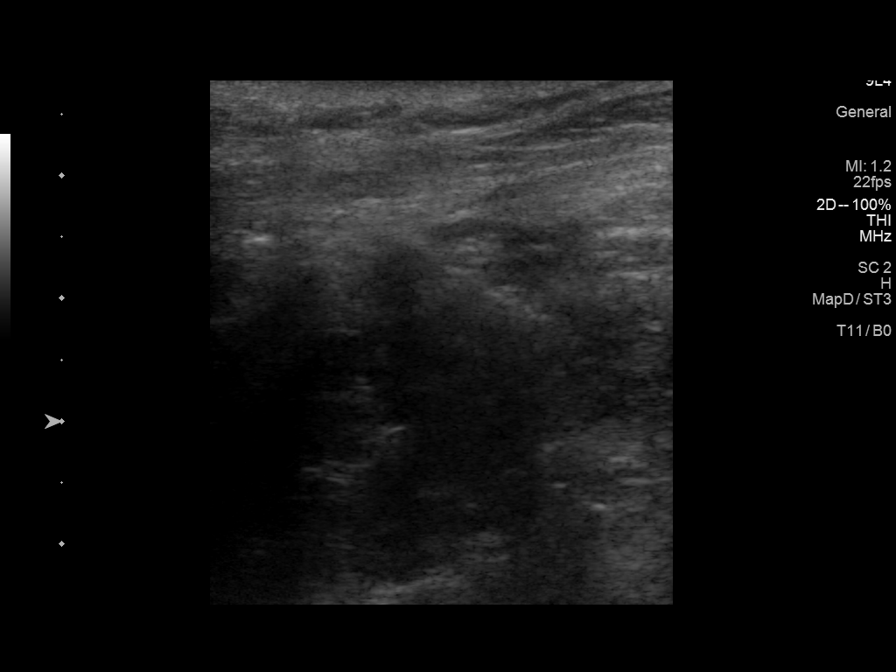
[im 6/9]
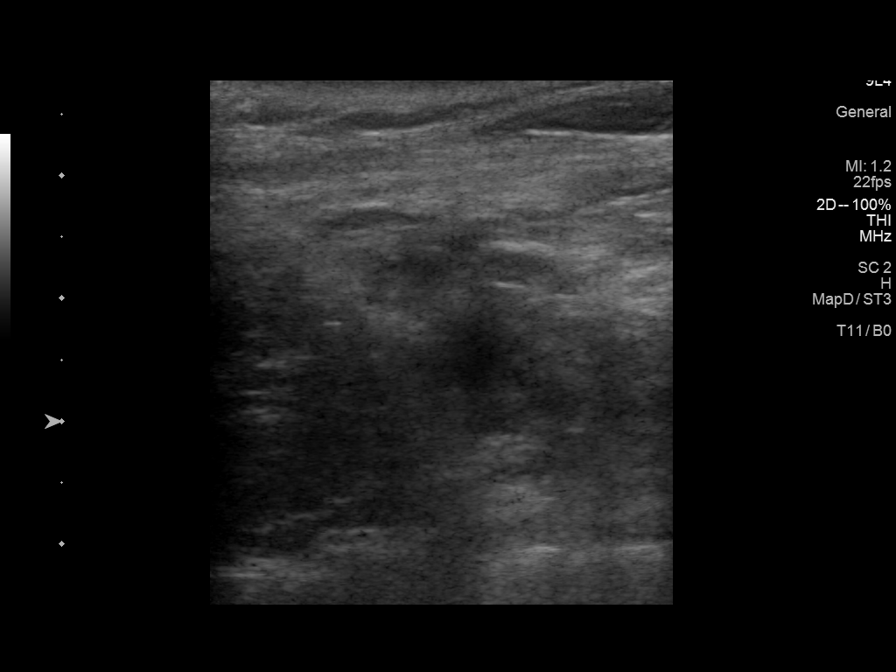
[im 7/9]
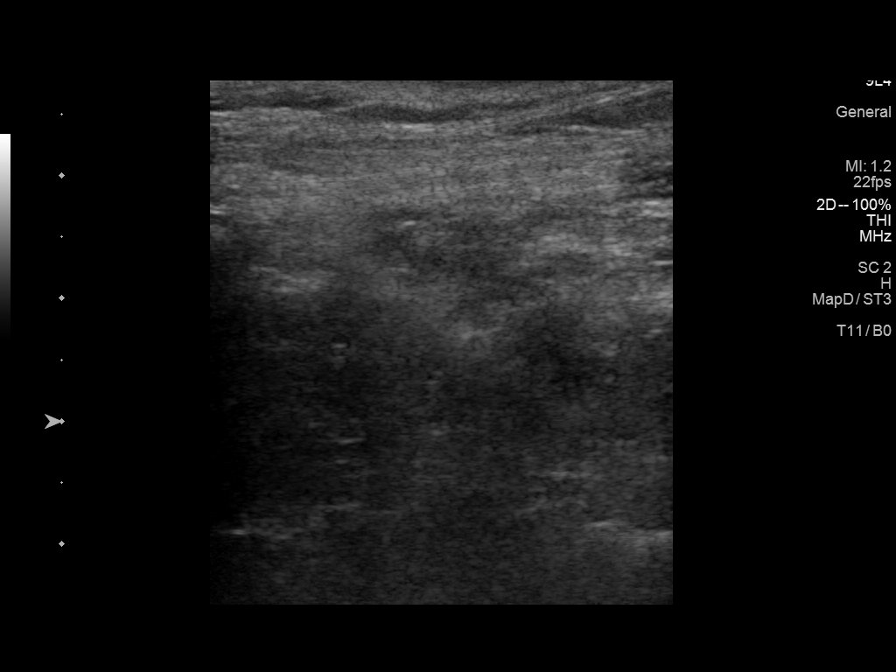
[im 8/9]
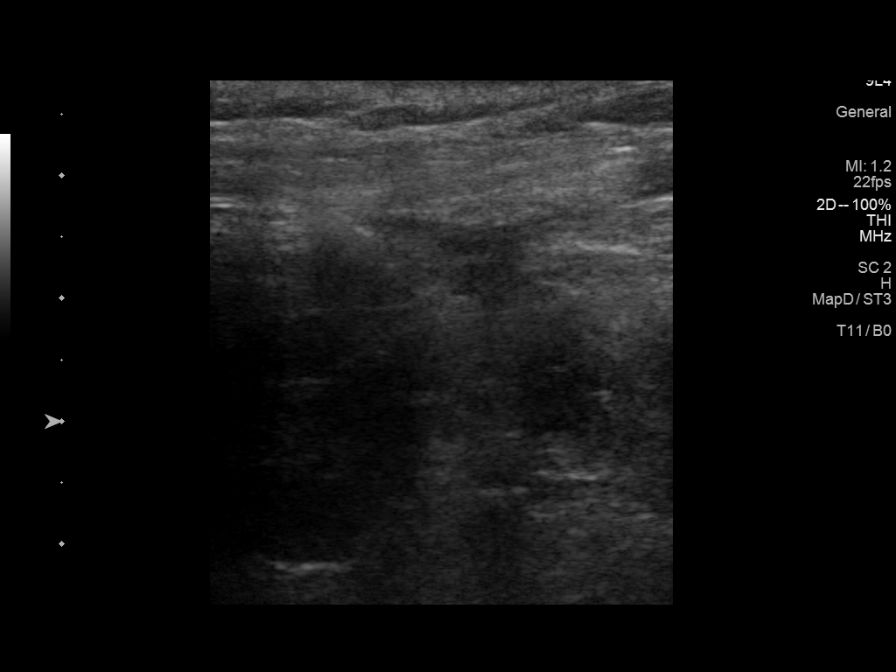
[im 9/9]
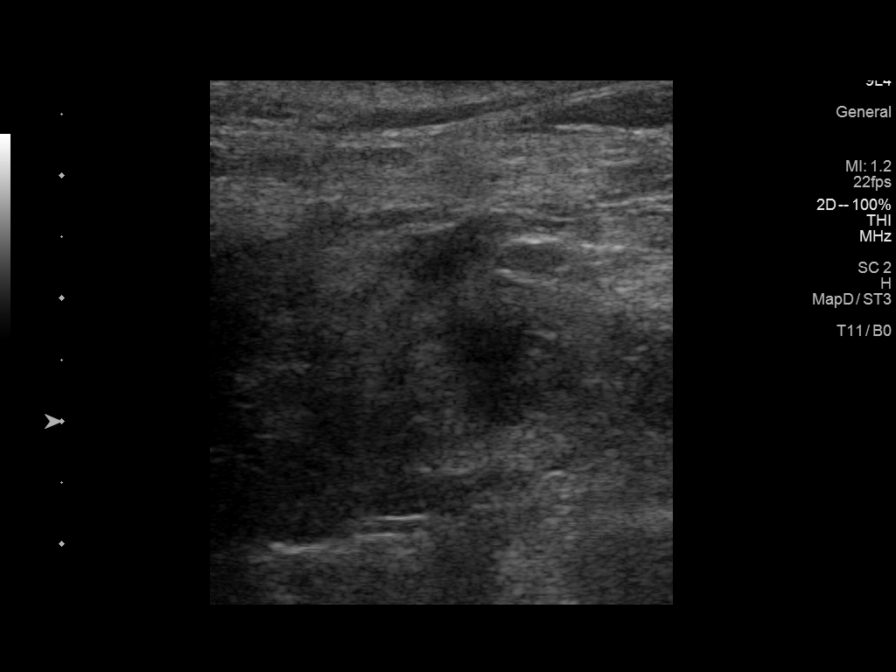

[9 of 9 positions shown; findings below may reference images not displayed]

MEDICATIONS:
1.5 mg IV Versed; 50 mcg IV Fentanyl

Total Moderate Sedation Time: 15 minutes.

The patient's level of consciousness and physiologic status were
continuously monitored during the procedure by Radiology nursing.

PROCEDURE:
The procedure, risks, benefits, and alternatives were explained to
the patient. Questions regarding the procedure were encouraged and
answered. The patient understands and consents to the procedure. A
time out was performed prior to initiating the procedure.

The right neck was prepped with chlorhexidine in a sterile fashion,
and a sterile drape was applied covering the operative field. A
sterile gown and sterile gloves were used for the procedure. Local
anesthesia was provided with 1% Lidocaine.

Ultrasound was used to localize right-sided supraclavicular/lower
cervical lymph node tissue. Under ultrasound guidance, a total of
518 gauge core biopsy samples were obtained and samples submitted in
formalin.

COMPLICATIONS:
None.
FINDINGS: Ill-defined soft tissue in the right supraclavicular region was
identified which appears to correlate anatomically to the abnormal
lymph node seen by PET scan. Solid tissue was obtained.
IMPRESSION: Ultrasound-guided core biopsy performed of abnormal appearing soft
tissue in the right supraclavicular neck which appears to correspond
to the abnormal hypermetabolic lymph nodes seen by PET scan.

## 2018-08-01 IMAGING — MR MR HEAD WO/W CM
11 of 13 series · 37 of 48 positions shown · IV contrast (15ml Multihance)
Comparison: None.

CLINICAL DATA: New diagnosis with lung cancer. Balance disturbance
over the last month. Staging.

EXAM:
MRI HEAD WITHOUT AND WITH CONTRAST
TECHNIQUE: Multiplanar, multiecho pulse sequences of the brain and surrounding
structures were obtained without and with intravenous contrast.
CONTRAST:  15mL MULTIHANCE GADOBENATE DIMEGLUMINE 529 MG/ML IV SOLN

[Series 2: T1 · sagittal · 5.0mm · 0.45mm/px · 3 of 27 slices shown (1 of 2)]
[im 1/27]
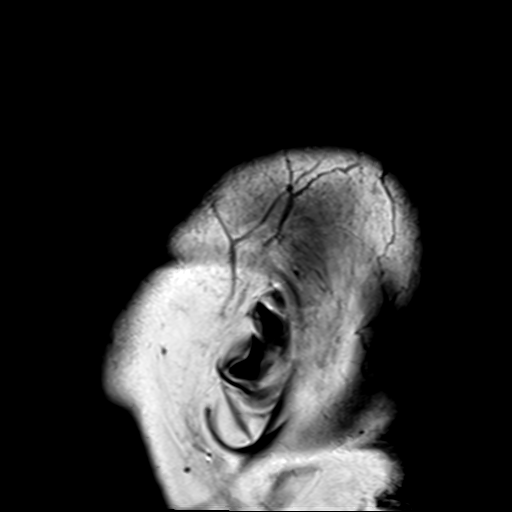
[im 14/27]
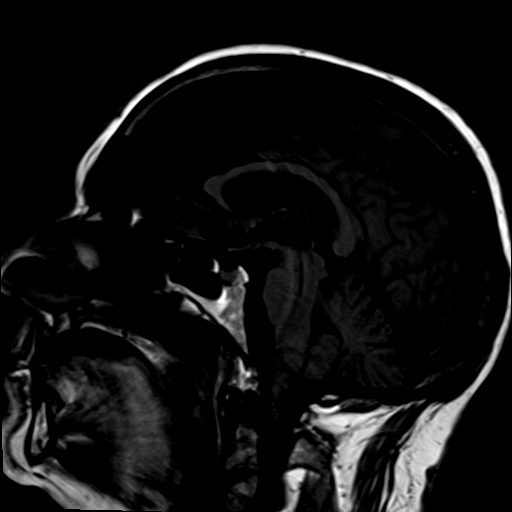
[im 27/27]
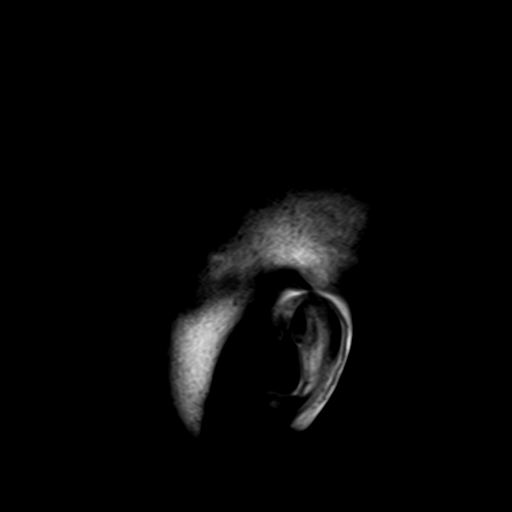

[Series 4: DWI · axial · 3.0mm · 1.80mm/px · z∈[-66,+87]mm · 5 of 54 slices shown (1 of 2)]
[im 1/54]
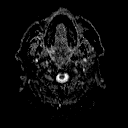
[im 14/54]
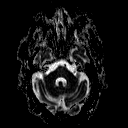
[im 27/54]
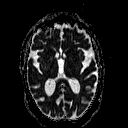
[im 40/54]
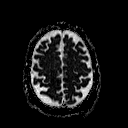
[im 54/54]
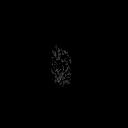

[Series 6: DWI · coronal · 3.0mm · 1.80mm/px · 4 of 47 slices shown (2 of 2)]
[im 1/47]
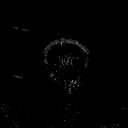
[im 16/47]
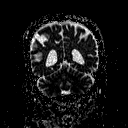
[im 31/47]
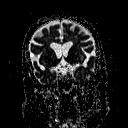
[im 47/47]
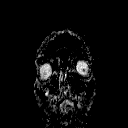

[Series 7: T2 · axial · 5.0mm · 0.60mm/px · z∈[-61,+88]mm · 2 of 25 slices shown (1 of 3)]
[im 1/25]
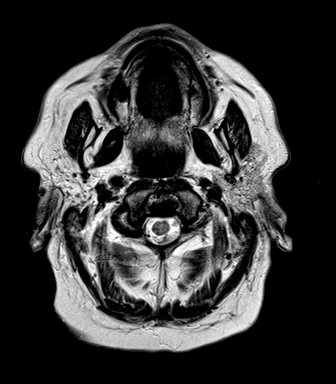
[im 25/25]
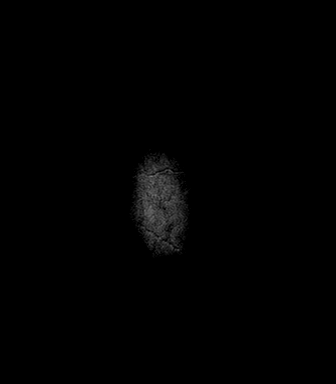

[Series 8: FLAIR · axial · 5.0mm · 0.45mm/px · z∈[-61,+88]mm · 2 of 25 slices shown]
[im 1/25]
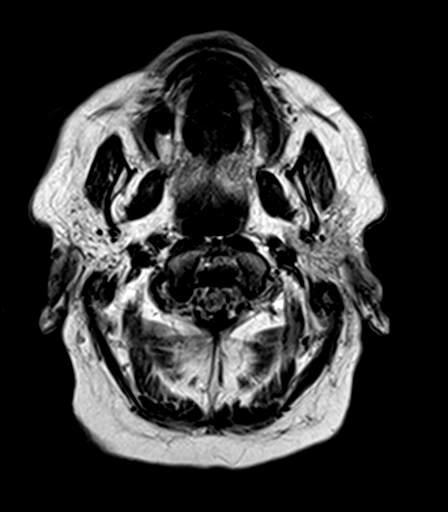
[im 25/25]
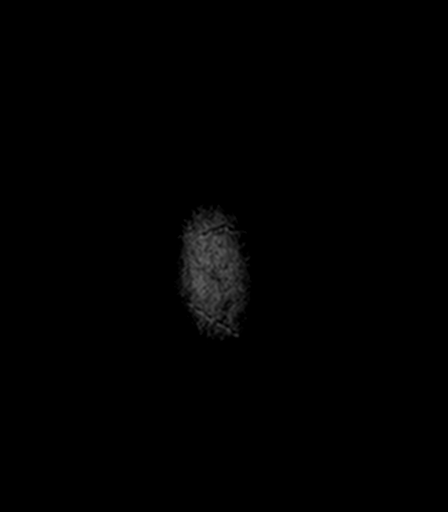

[Series 9: T2 · axial · 5.0mm · 0.45mm/px · z∈[-61,+88]mm · 2 of 25 slices shown (2 of 3)]
[im 1/25]
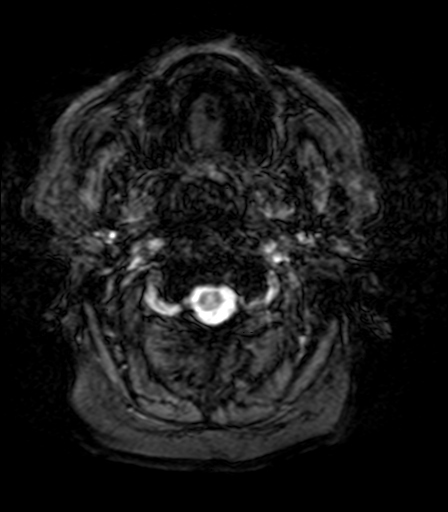
[im 25/25]
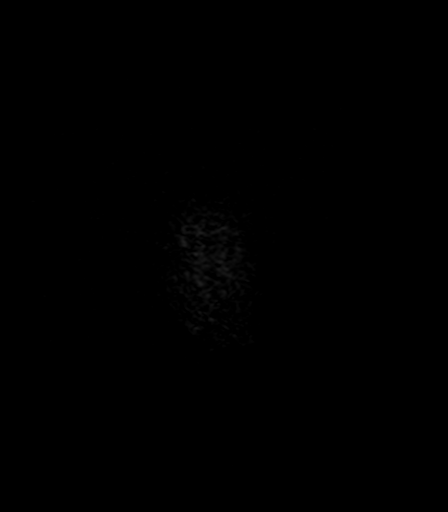

[Series 10: T1 · axial · 3.0mm · 1.00mm/px · z∈[-68,+33]mm · 4 of 60 slices shown (2 of 2)]
[im 1/60]
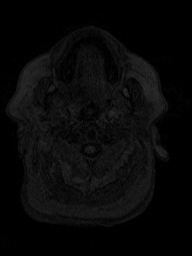
[im 12/60]
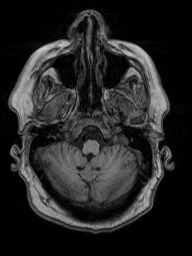
[im 24/60]
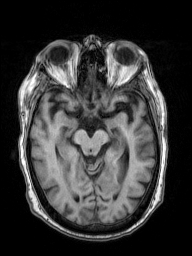
[im 36/60]
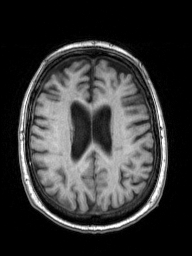

[Series 11: T2 · coronal · 5.0mm · 0.49mm/px · 3 of 29 slices shown (3 of 3)]
[im 1/29]
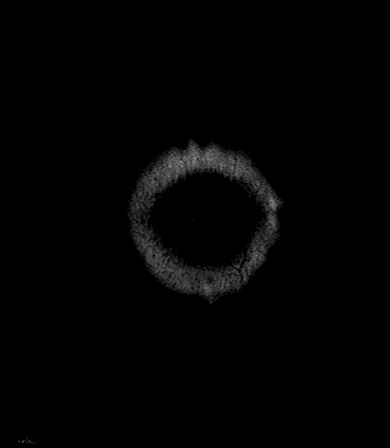
[im 15/29]
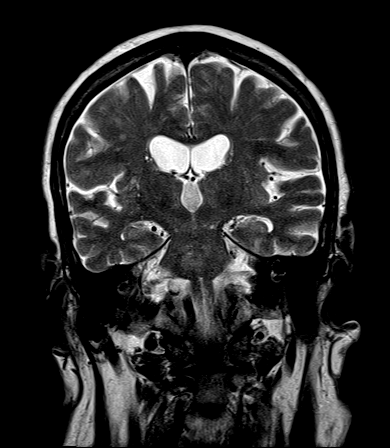
[im 29/29]
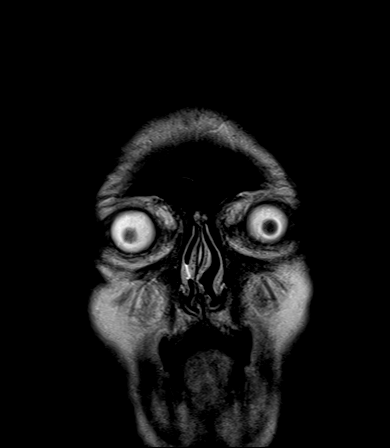

[Series 12: T1 post-contrast · axial · 3.0mm · 1.00mm/px · z∈[-68,+102]mm · 6 of 60 slices shown (1 of 3)]
[im 1/60]
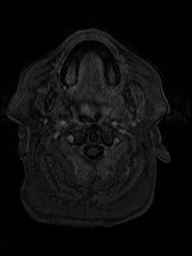
[im 12/60]
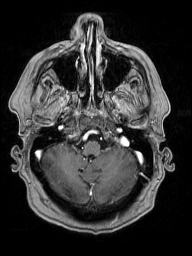
[im 24/60]
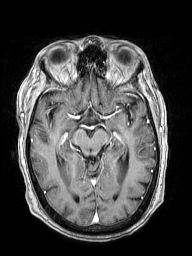
[im 36/60]
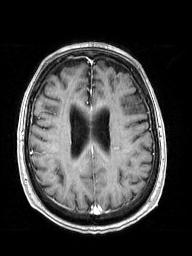
[im 48/60]
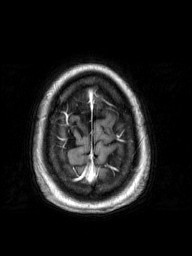
[im 60/60]
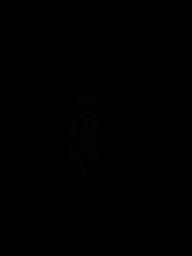

[Series 13: T1 post-contrast · coronal · 5.0mm · 0.43mm/px · 3 of 29 slices shown (2 of 3)]
[im 1/29]
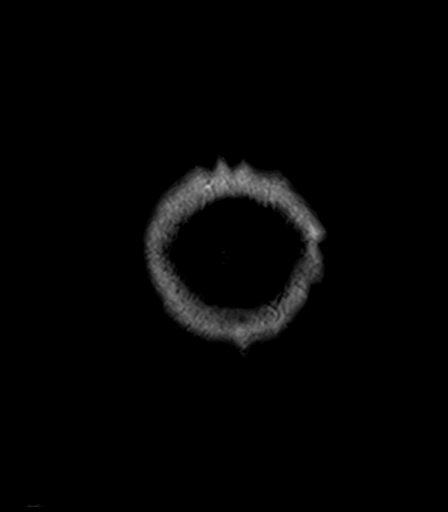
[im 15/29]
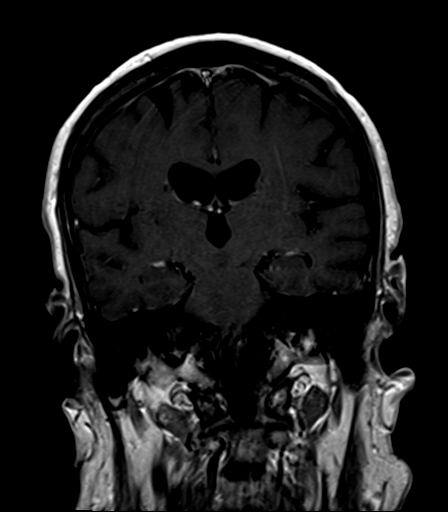
[im 29/29]
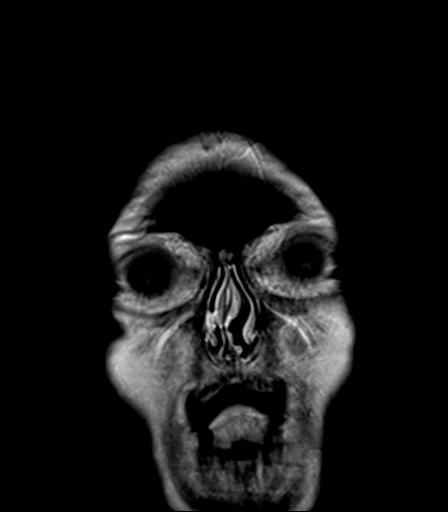

[Series 14: T1 post-contrast · sagittal · 5.0mm · 0.45mm/px · 3 of 27 slices shown (3 of 3)]
[im 1/27]
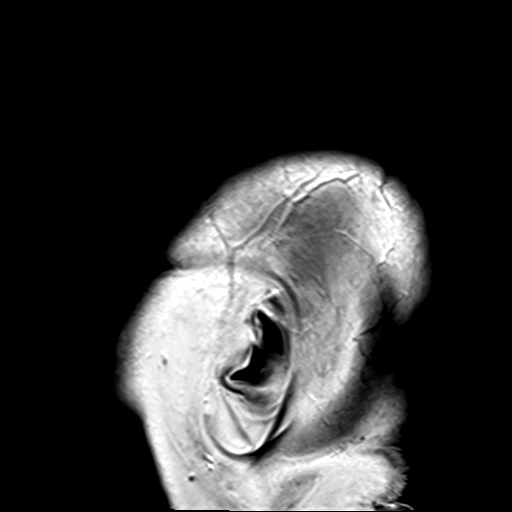
[im 14/27]
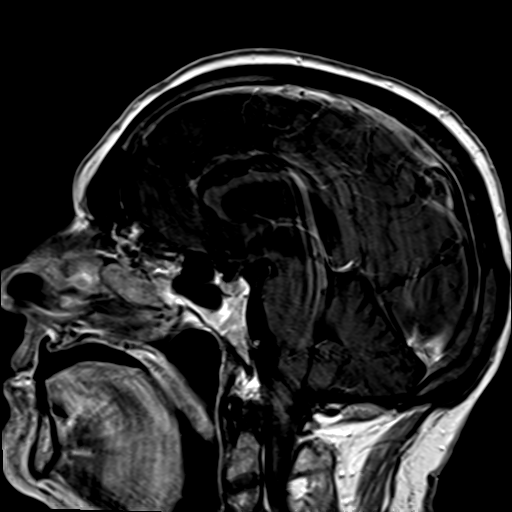
[im 27/27]
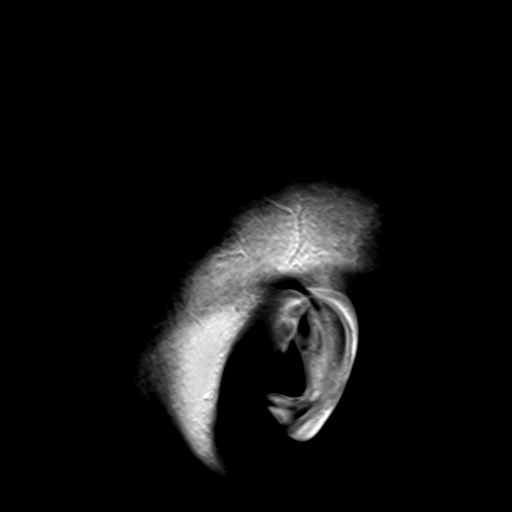

[37 of 48 positions shown; findings below may reference images not displayed]

FINDINGS: Brain: Brainstem shows chronic small-vessel ischemic changes. No
focal cerebellar insult. Within the cerebral hemispheres, there is a
subcentimeter focus of restricted diffusion in the deep white matter
adjacent to the posterior body of the left lateral ventricle most
consistent with a subacute infarction. There are mild chronic
small-vessel ischemic changes elsewhere throughout the hemispheric
white matter. After contrast administration, there is minimal linear
enhancement in this location, most typical of subacute infarction.
This does not look like a rounded mass typical of a metastasis.
There is no vasogenic edema. However, there are other small foci of
contrast enhancement seen within both cerebral hemispheres that
raise concern for small metastases. The differential diagnosis does
include other small micro embolic infarctions which are older, but
there is no restricted diffusion and I favor, particularly given the
primary chest lesion in this case, that we are dealing with early
cerebral metastatic disease. Lesions are as follows.

Axial image 32.  6 mm focus right frontal subcortical white matter.

Axial image 40.  6 mm focus medial left parietal lobe.

Axial image 39.  2 mm focus right posterior frontal cortex.

Axial image 37.  4 mm focus left frontal cortex.

No hydrocephalus. No extra-axial collection. No skull or skullbase
lesion identified.

Vascular: Major vessels at the base of the brain show flow.

Skull and upper cervical spine: Negative

Sinuses/Orbits: Clear/normal

Other: None significant
IMPRESSION: Indeterminate study, but with suspicion of small/early metastatic
disease to the brain.

Subcentimeter region of restricted diffusion in the deep white
matter adjacent to the posterior body of the left lateral ventricle
that is most consistent with a subacute white matter infarction. The
patient does have small-vessel changes elsewhere throughout the
brain.

Other scattered foci as listed above which could either be older
subacute infarctions which no longer show restricted diffusion or
could represent small metastatic lesions. Small metastatic lesions
are favored for those foci. I realize this presents a treatment and
diagnostic dilemma at this point, but my presumption is that these
are small metastases. Repeat scanning could be done in 4 weeks to
assess for any change. None of the lesions are associated with
vasogenic edema or mass effect.
